# Patient Record
Sex: Female | Born: 1950
Health system: Southern US, Community
[De-identification: ages and names within clinical notes are randomized; demographics above are authoritative.]

## PROBLEM LIST (undated history)

## (undated) DIAGNOSIS — M8580 Other specified disorders of bone density and structure, unspecified site: Secondary | ICD-10-CM

## (undated) DIAGNOSIS — I10 Essential (primary) hypertension: Secondary | ICD-10-CM

## (undated) DIAGNOSIS — I493 Ventricular premature depolarization: Secondary | ICD-10-CM

## (undated) HISTORY — DX: Essential (primary) hypertension: I10

## (undated) HISTORY — PX: ABDOMINAL HYSTERECTOMY: SHX81

## (undated) HISTORY — DX: Other specified disorders of bone density and structure, unspecified site: M85.80

## (undated) HISTORY — PX: COLON SURGERY: SHX602

## (undated) HISTORY — PX: BREAST CYST EXCISION: SHX579

## (undated) HISTORY — PX: BREAST SURGERY: SHX581

## (undated) HISTORY — PX: BREAST EXCISIONAL BIOPSY: SUR124

## (undated) HISTORY — DX: Ventricular premature depolarization: I49.3

## (undated) HISTORY — PX: COLON RESECTION: SHX5231

---

## 2019-08-29 ENCOUNTER — Ambulatory Visit (INDEPENDENT_AMBULATORY_CARE_PROVIDER_SITE_OTHER): Payer: Medicare Other | Admitting: Adult Health Nurse Practitioner

## 2019-08-29 ENCOUNTER — Encounter: Payer: Self-pay | Admitting: Adult Health Nurse Practitioner

## 2019-08-29 ENCOUNTER — Other Ambulatory Visit: Payer: Self-pay

## 2019-08-29 VITALS — BP 151/90 | HR 81 | Temp 98.1°F | Ht 63.0 in | Wt 150.4 lb

## 2019-08-29 DIAGNOSIS — C44111 Basal cell carcinoma of skin of unspecified eyelid, including canthus: Secondary | ICD-10-CM

## 2019-08-29 DIAGNOSIS — E559 Vitamin D deficiency, unspecified: Secondary | ICD-10-CM | POA: Insufficient documentation

## 2019-08-29 DIAGNOSIS — Z780 Asymptomatic menopausal state: Secondary | ICD-10-CM | POA: Insufficient documentation

## 2019-08-29 DIAGNOSIS — Z1231 Encounter for screening mammogram for malignant neoplasm of breast: Secondary | ICD-10-CM | POA: Diagnosis not present

## 2019-08-29 DIAGNOSIS — I10 Essential (primary) hypertension: Secondary | ICD-10-CM | POA: Insufficient documentation

## 2019-08-29 DIAGNOSIS — M85859 Other specified disorders of bone density and structure, unspecified thigh: Secondary | ICD-10-CM | POA: Diagnosis not present

## 2019-08-29 DIAGNOSIS — M8589 Other specified disorders of bone density and structure, multiple sites: Secondary | ICD-10-CM

## 2019-08-29 DIAGNOSIS — M8580 Other specified disorders of bone density and structure, unspecified site: Secondary | ICD-10-CM | POA: Insufficient documentation

## 2019-08-29 LAB — POCT URINALYSIS DIP (MANUAL ENTRY)
Bilirubin, UA: NEGATIVE
Glucose, UA: NEGATIVE mg/dL
Ketones, POC UA: NEGATIVE mg/dL
Leukocytes, UA: NEGATIVE
Nitrite, UA: NEGATIVE
Protein Ur, POC: NEGATIVE mg/dL
Spec Grav, UA: 1.015 (ref 1.010–1.025)
Urobilinogen, UA: 0.2 E.U./dL
pH, UA: 7.5 (ref 5.0–8.0)

## 2019-08-29 MED ORDER — MOMETASONE FUROATE 50 MCG/ACT NA SUSP: 2.0000 | Freq: Every day | NASAL | 12 refills | Status: DC

## 2019-08-29 NOTE — Progress Notes (Signed)
.  Chief Complaint  Patient presents with  . Establish Care    nasal drainage x1 week    HPI   Patient presents to establish care.  She notes a hx of osteopenia, diagnosed in 2016.  She had a colon resection in 2004 with subsequent negative colonoscopies, last normal in 2019.  Last mammogram 2019, negative.   Family history is significant for DM, hypertension, Alzheimer's and prostate cancer in father.  Mother had melanoma and currently, dementia.  2016--osteopenia  Current problems include nasal congestion/allergies and elevated BP.  We discussed treating BP and ordering baseline labs.    Problem List    Problem List: 2021-02: Osteopenia of neck of femur 2021-02: Basal cell carcinoma (BCC) of canthus 2021-02: Encounter for screening mammogram for malignant neoplasm of  breast 2021-02: Vitamin D deficiency 2021-02: Essential hypertension   Allergies   has No Known Allergies.  Medications    Current Outpatient Medications:  .  amLODipine (NORVASC) 5 MG tablet, TAKE 1 TABLET BY MOUTH EVERY DAY, Disp: , Rfl:  .  hydrochlorothiazide (HYDRODIURIL) 25 MG tablet, TAKE 1 TABLET BY MOUTH EVERY DAY AS DIRECTED, Disp: , Rfl:  .  lisinopril-hydrochlorothiazide (ZESTORETIC) 20-25 MG tablet, Take 1 tablet by mouth daily., Disp: 30 tablet, Rfl: 3 .  mometasone (NASONEX) 50 MCG/ACT nasal spray, Place 2 sprays into the nose daily., Disp: 17 g, Rfl: 12   Review of Systems    Constitutional: Negative for activity change, appetite change, chills and fever.  HENT: Negative for congestion, nosebleeds, trouble swallowing and voice change.   Respiratory: Negative for cough, shortness of breath and wheezing.   Cardiac:  Negative for chest pain, pressure, syncope  Gastrointestinal: Negative for diarrhea, nausea and vomiting.  Genitourinary: Negative for difficulty urinating, dysuria, flank pain and hematuria.  Musculoskeletal: Negative for back pain, joint swelling and neck pain.    Neurological: Negative for dizziness, speech difficulty, light-headedness and numbness.  See HPI. All other review of systems negative.     Physical Exam:    height is 5' 3" (1.6 m) and weight is 150 lb 6.4 oz (68.2 kg). Her temporal temperature is 98.1 F (36.7 C). Her blood pressure is 151/90 (abnormal) and her pulse is 81. Her oxygen saturation is 94%.   Physical Examination: General appearance - alert, well appearing, and in no distress and oriented to person, place, and time Mental status - normal mood, behavior, speech, dress, motor activity, and thought processes Eyes - PERRL. Extraocular movements intact.  No nystagmus.  Neck - supple, no significant adenopathy, carotids upstroke normal bilaterally, no bruits, thyroid exam: thyroid is normal in size without nodules or tenderness Chest - clear to auscultation, no wheezes, rales or rhonchi, symmetric air entry  Heart - normal rate, regular rhythm, normal S1, S2, no murmurs, rubs, clicks or gallops Extremities - dependent LE edema without clubbing or cyanosis Skin - normal coloration and turgor, no rashes, no suspicious skin lesions noted  No hyperpigmentation of skin.  No current hematomas noted   Lab /Imaging Review    orders written for new lab studies as appropriate; see orders, no lab studies available for review at time of visit.   Assessment & Plan:  Prerana Strayer is a 69 y.o. female    1. Encounter for screening mammogram for malignant neoplasm of breast   2. Basal cell carcinoma of canthus, unspecified laterality   3. Osteopenia of neck of femur, unspecified laterality   4. Vitamin D deficiency   5. Essential hypertension  6. Osteopenia of multiple sites     Orders Placed This Encounter  Procedures  . MM Digital Screening  . DG Bone Density  . CMP14+EGFR  . Lipid panel  . TSH  . VITAMIN D 25 Hydroxy (Vit-D Deficiency, Fractures)  . Ambulatory referral to Gynecology  . Ambulatory referral to Dermatology  .  POCT urinalysis dipstick   Meds ordered this encounter  Medications  . mometasone (NASONEX) 50 MCG/ACT nasal spray    Sig: Place 2 sprays into the nose daily.    Dispense:  17 g    Refill:  12  . lisinopril-hydrochlorothiazide (ZESTORETIC) 20-25 MG tablet    Sig: Take 1 tablet by mouth daily.    Dispense:  30 tablet    Refill:  3     Glyn Ade, NP

## 2019-08-29 NOTE — Patient Instructions (Addendum)
 Health Maintenance After Age 68 After age 68, you are at a higher risk for certain long-term diseases and infections as well as injuries from falls. Falls are a major cause of broken bones and head injuries in people who are older than age 68. Getting regular preventive care can help to keep you healthy and well. Preventive care includes getting regular testing and making lifestyle changes as recommended by your health care provider. Talk with your health care provider about:  Which screenings and tests you should have. A screening is a test that checks for a disease when you have no symptoms.  A diet and exercise plan that is right for you. What should I know about screenings and tests to prevent falls? Screening and testing are the best ways to find a health problem early. Early diagnosis and treatment give you the best chance of managing medical conditions that are common after age 68. Certain conditions and lifestyle choices may make you more likely to have a fall. Your health care provider may recommend:  Regular vision checks. Poor vision and conditions such as cataracts can make you more likely to have a fall. If you wear glasses, make sure to get your prescription updated if your vision changes.  Medicine review. Work with your health care provider to regularly review all of the medicines you are taking, including over-the-counter medicines. Ask your health care provider about any side effects that may make you more likely to have a fall. Tell your health care provider if any medicines that you take make you feel dizzy or sleepy.  Osteoporosis screening. Osteoporosis is a condition that causes the bones to get weaker. This can make the bones weak and cause them to break more easily.  Blood pressure screening. Blood pressure changes and medicines to control blood pressure can make you feel dizzy.  Strength and balance checks. Your health care provider may recommend certain tests to check  your strength and balance while standing, walking, or changing positions.  Foot health exam. Foot pain and numbness, as well as not wearing proper footwear, can make you more likely to have a fall.  Depression screening. You may be more likely to have a fall if you have a fear of falling, feel emotionally low, or feel unable to do activities that you used to do.  Alcohol use screening. Using too much alcohol can affect your balance and may make you more likely to have a fall. What actions can I take to lower my risk of falls? General instructions  Talk with your health care provider about your risks for falling. Tell your health care provider if: ? You fall. Be sure to tell your health care provider about all falls, even ones that seem minor. ? You feel dizzy, sleepy, or off-balance.  Take over-the-counter and prescription medicines only as told by your health care provider. These include any supplements.  Eat a healthy diet and maintain a healthy weight. A healthy diet includes low-fat dairy products, low-fat (lean) meats, and fiber from whole grains, beans, and lots of fruits and vegetables. Home safety  Remove any tripping hazards, such as rugs, cords, and clutter.  Install safety equipment such as grab bars in bathrooms and safety rails on stairs.  Keep rooms and walkways well-lit. Activity   Follow a regular exercise program to stay fit. This will help you maintain your balance. Ask your health care provider what types of exercise are appropriate for you.  If you need a cane   or walker, use it as recommended by your health care provider.  Wear supportive shoes that have nonskid soles. Lifestyle  Do not drink alcohol if your health care provider tells you not to drink.  If you drink alcohol, limit how much you have: ? 0-1 drink a day for women. ? 0-2 drinks a day for men.  Be aware of how much alcohol is in your drink. In the [place] one drink equals one typical bottle of  beer (12 oz), one-half glass of wine (5 oz), or one shot of hard liquor (1 oz).  Do not use any products that contain nicotine or tobacco, such as cigarettes and e-cigarettes. If you need help quitting, ask your health care provider. Summary  Having a healthy lifestyle and getting preventive care can help to protect your health and wellness after age 68.  Screening and testing are the best way to find a health problem early and help you avoid having a fall. Early diagnosis and treatment give you the best chance for managing medical conditions that are more common for people who are older than age 68.  Falls are a major cause of broken bones and head injuries in people who are older than age 68. Take precautions to prevent a fall at home.  Work with your health care provider to learn what changes you can make to improve your health and wellness and to prevent falls. This information is not intended to replace advice given to you by your health care provider. Make sure you discuss any questions you have with your health care provider. Document Revised: 10/13/2018 Document Reviewed: 05/05/2017 Elsevier Patient Education  2020 [employer]    If you have lab work done today you will be contacted with your lab results within the next 2 weeks.  If you have not heard from us then please contact us. The fastest way to get your results is to register for My Chart.   IF you received an x-ray today, you will receive an invoice from [facility]. Please contact [facility] at [phone] with questions or concerns regarding your invoice.   IF you received labwork today, you will receive an invoice from [facility]. Please contact [facility] at [phone] with questions or concerns regarding your invoice.   Our billing staff will not be able to assist you with questions regarding bills from these companies.  You will be contacted with the lab results as soon as they are available. The  fastest way to get your results is to activate your My Chart account. Instructions are located on the last page of this paperwork. If you have not heard from us regarding the results in 2 weeks, please contact this office.      

## 2019-08-30 LAB — CMP14+EGFR
ALT: 10 IU/L (ref 0–32)
AST: 20 IU/L (ref 0–40)
Albumin/Globulin Ratio: 1.1 — ABNORMAL LOW (ref 1.2–2.2)
Albumin: 4 g/dL (ref 3.8–4.8)
Alkaline Phosphatase: 94 IU/L (ref 39–117)
BUN/Creatinine Ratio: 12 (ref 12–28)
BUN: 10 mg/dL (ref 8–27)
Bilirubin Total: 0.3 mg/dL (ref 0.0–1.2)
CO2: 25 mmol/L (ref 20–29)
Calcium: 9.4 mg/dL (ref 8.7–10.3)
Chloride: 102 mmol/L (ref 96–106)
Creatinine, Ser: 0.82 mg/dL (ref 0.57–1.00)
GFR calc Af Amer: 86 mL/min/{1.73_m2} (ref >59)
GFR calc non Af Amer: 74 mL/min/{1.73_m2} (ref >59)
Globulin, Total: 3.5 g/dL (ref 1.5–4.5)
Glucose: 92 mg/dL (ref 65–99)
Potassium: 3.8 mmol/L (ref 3.5–5.2)
Sodium: 142 mmol/L (ref 134–144)
Total Protein: 7.5 g/dL (ref 6.0–8.5)

## 2019-08-30 LAB — VITAMIN D 25 HYDROXY (VIT D DEFICIENCY, FRACTURES): Vit D, 25-Hydroxy: 34.8 ng/mL (ref 30.0–100.0)

## 2019-08-30 LAB — LIPID PANEL
Chol/HDL Ratio: 3.7 ratio (ref 0.0–4.4)
Cholesterol, Total: 191 mg/dL (ref 100–199)
HDL: 52 mg/dL (ref >39)
LDL Chol Calc (NIH): 118 mg/dL — ABNORMAL HIGH (ref 0–99)
Triglycerides: 116 mg/dL (ref 0–149)
VLDL Cholesterol Cal: 21 mg/dL (ref 5–40)

## 2019-08-30 LAB — TSH: TSH: 1.94 u[IU]/mL (ref 0.450–4.500)

## 2019-08-30 MED ORDER — LISINOPRIL-HYDROCHLOROTHIAZIDE 20-25 MG PO TABS: 1.0000 | ORAL_TABLET | Freq: Every day | ORAL | 3 refills | Status: DC

## 2019-09-12 ENCOUNTER — Encounter: Payer: Self-pay | Admitting: Adult Health Nurse Practitioner

## 2019-09-12 NOTE — Progress Notes (Signed)
Spoke with pt to give her her lab results.

## 2019-09-15 ENCOUNTER — Telehealth: Payer: Self-pay | Admitting: Adult Health Nurse Practitioner

## 2019-09-15 NOTE — Telephone Encounter (Signed)
Spoke with pt and she understands to continue taking the hydrochlorothiazide until her F/U appt.

## 2019-09-15 NOTE — Telephone Encounter (Signed)
Pt called had a new pt apt with provider on 08/29/19 and is requesting a medication refill. Pt states that she has been out for a week already. Pt also has an apt with provider on 09/21/19. Please advise.  hydrochlorothiazide (HYDRODIURIL) 25 MG tablet [id number]  [id number] [place], [place] - [address]

## 2019-09-21 ENCOUNTER — Other Ambulatory Visit: Payer: Self-pay

## 2019-09-21 ENCOUNTER — Encounter: Payer: Self-pay | Admitting: Adult Health Nurse Practitioner

## 2019-09-21 ENCOUNTER — Ambulatory Visit (INDEPENDENT_AMBULATORY_CARE_PROVIDER_SITE_OTHER): Payer: Medicare Other | Admitting: Adult Health Nurse Practitioner

## 2019-09-21 VITALS — BP 126/75 | HR 88 | Temp 98.7°F | Ht 63.0 in | Wt 150.0 lb

## 2019-09-21 DIAGNOSIS — M85859 Other specified disorders of bone density and structure, unspecified thigh: Secondary | ICD-10-CM | POA: Diagnosis not present

## 2019-09-21 DIAGNOSIS — I10 Essential (primary) hypertension: Secondary | ICD-10-CM | POA: Diagnosis not present

## 2019-09-21 DIAGNOSIS — Z1231 Encounter for screening mammogram for malignant neoplasm of breast: Secondary | ICD-10-CM | POA: Diagnosis not present

## 2019-09-21 LAB — POCT URINALYSIS DIP (CLINITEK)
Bilirubin, UA: NEGATIVE
Glucose, UA: NEGATIVE mg/dL
Ketones, POC UA: NEGATIVE mg/dL
Leukocytes, UA: NEGATIVE
Nitrite, UA: NEGATIVE
POC PROTEIN,UA: NEGATIVE
Spec Grav, UA: 1.02 (ref 1.010–1.025)
Urobilinogen, UA: 0.2 E.U./dL
pH, UA: 5.5 (ref 5.0–8.0)

## 2019-09-21 MED ORDER — HYDROCHLOROTHIAZIDE 25 MG PO TABS: 25.0000 mg | ORAL_TABLET | Freq: Every day | ORAL | 2 refills | Status: DC

## 2019-09-21 MED ORDER — AMLODIPINE BESYLATE 5 MG PO TABS: 5.0000 mg | ORAL_TABLET | Freq: Every day | ORAL | 2 refills | Status: DC

## 2019-09-21 NOTE — Patient Instructions (Signed)
° ° ° °  If you have lab work done today you will be contacted with your lab results within the next 2 weeks.  If you have not heard from us then please contact us. The fastest way to get your results is to register for My Chart. ° ° °IF you received an x-ray today, you will receive an invoice from [facility]. Please contact [facility] at [phone] with questions or concerns regarding your invoice.  ° °IF you received labwork today, you will receive an invoice from [facility]. Please contact [facility] at [phone] with questions or concerns regarding your invoice.  ° °Our billing staff will not be able to assist you with questions regarding bills from these companies. ° °You will be contacted with the lab results as soon as they are available. The fastest way to get your results is to activate your My Chart account. Instructions are located on the last page of this paperwork. If you have not heard from us regarding the results in 2 weeks, please contact this office. °  ° ° ° °

## 2019-09-21 NOTE — Progress Notes (Signed)
Subjective:    Samantha Brady is here for follow-up of her hypertension. Her high blood pressure was first noted in the past. Home blood pressure readings: she brought in today show BPs from 0000000 systolic over [id number] diastolic. Salt intake and diet: salt not added to cooking. Usual weight: at usual weight . Associated signs and symptoms: none. Patient denies: none. Use of agents associated with hypertension: none. Medication compliance: taking as prescribed.  The following portions of the patient's history were reviewed and updated as appropriate:  She  has a past medical history of Hypertension. Current Outpatient Medications on File Prior to Visit  Medication Sig Dispense Refill  . mometasone (NASONEX) 50 MCG/ACT nasal spray Place 2 sprays into the nose daily. 17 g 12   No current facility-administered medications on file prior to visit.   She has No Known Allergies..   Review of Systems Pertinent items noted in HPI and remainder of comprehensive ROS otherwise negative.     Objective:    Physical Exam: BP 126/75 (BP Location: Right Arm, Patient Position: Sitting, Cuff Size: Normal)   Pulse 88   Temp 98.7 F (37.1 C) (Temporal)   Ht 5\' 3"  (1.6 m)   Wt 150 lb (68 kg)   SpO2 94%   BMI 26.57 kg/m  General appearance: alert and cooperative Lungs: clear to auscultation bilaterally Heart: regular rate and rhythm, S1, S2 normal, no murmur, click, rub or gallop Pulses: 2+ and symmetric Skin: Skin color, texture, turgor normal. No rashes or lesions  Lab Review  Lab Results  Component Value Date   CHOL 191 08/29/2019   TRIG 116 08/29/2019   HDL 52 08/29/2019       Assessment:    Hypertension, controlled on current medication   Plan:    Medication: no change. Regular aerobic exercise. Follow up: 3 months and as needed.    Glyn Ade, NP

## 2019-12-01 ENCOUNTER — Telehealth: Payer: Self-pay | Admitting: Adult Health Nurse Practitioner

## 2019-12-01 NOTE — Telephone Encounter (Signed)
Called pt LVMTCB 12/01/19 @ 2:32 Pm

## 2019-12-21 ENCOUNTER — Ambulatory Visit: Payer: Medicare Other | Admitting: Adult Health Nurse Practitioner

## 2020-01-18 ENCOUNTER — Other Ambulatory Visit: Payer: Self-pay

## 2020-01-18 ENCOUNTER — Encounter: Payer: Self-pay | Admitting: Family Medicine

## 2020-01-18 ENCOUNTER — Telehealth: Payer: Self-pay

## 2020-01-18 ENCOUNTER — Ambulatory Visit (INDEPENDENT_AMBULATORY_CARE_PROVIDER_SITE_OTHER): Payer: Medicare Other | Admitting: Family Medicine

## 2020-01-18 VITALS — BP 117/77 | HR 75 | Temp 97.7°F | Ht 63.0 in | Wt 147.2 lb

## 2020-01-18 DIAGNOSIS — Z1231 Encounter for screening mammogram for malignant neoplasm of breast: Secondary | ICD-10-CM

## 2020-01-18 DIAGNOSIS — Z780 Asymptomatic menopausal state: Secondary | ICD-10-CM | POA: Diagnosis not present

## 2020-01-18 DIAGNOSIS — I10 Essential (primary) hypertension: Secondary | ICD-10-CM | POA: Diagnosis not present

## 2020-01-18 DIAGNOSIS — Z86010 Personal history of colonic polyps: Secondary | ICD-10-CM

## 2020-01-18 DIAGNOSIS — E559 Vitamin D deficiency, unspecified: Secondary | ICD-10-CM

## 2020-01-18 DIAGNOSIS — Z1159 Encounter for screening for other viral diseases: Secondary | ICD-10-CM

## 2020-01-18 NOTE — Telephone Encounter (Signed)
Medical records, immunizations and colonoscopy request was faxed to pt pcp and gastro doctor in [place] for continuity of care.

## 2020-01-18 NOTE — Patient Instructions (Signed)
° ° ° °  If you have lab work done today you will be contacted with your lab results within the next 2 weeks.  If you have not heard from us then please contact us. The fastest way to get your results is to register for My Chart. ° ° °IF you received an x-ray today, you will receive an invoice from [facility]. Please contact  Radiology at [phone] with questions or concerns regarding your invoice.  ° °IF you received labwork today, you will receive an invoice from [facility]. Please contact [facility] at [phone] with questions or concerns regarding your invoice.  ° °Our billing staff will not be able to assist you with questions regarding bills from these companies. ° °You will be contacted with the lab results as soon as they are available. The fastest way to get your results is to activate your My Chart account. Instructions are located on the last page of this paperwork. If you have not heard from us regarding the results in 2 weeks, please contact this office. °  ° ° ° °

## 2020-01-18 NOTE — Progress Notes (Signed)
7/15/202111:36 AM  Samantha Brady [birth year], 69 y.o., female [id number]  Chief Complaint  Patient presents with  . Hypertension    HPI:   Patient is a 69 y.o. female with past medical history significant for HTN, vitamin D deficiency, osteopenia who presents today for routine followup/transfer of care  Last OV march 2021, Byrd NP - no changes  HTN - takes amlodipine and HCTZ daily denies any history of hypokalemia Lab Results  Component Value Date   CREATININE 0.82 08/29/2019   BUN 10 08/29/2019   NA 142 08/29/2019   K 3.8 08/29/2019   CL 102 08/29/2019   CO2 25 08/29/2019    Vitamin D deficiency/osteopenia -  takes 2000 units a day, used to walk 3-4 times a week,  now walking 1-2 a week due to covid,  planning on increasing frequency Last dexa more than 2 years ago  Last mammogram 2018 Had partial hyst for fibroids, one ovary remains  Had colon resection in 2003 due to very large precancerous polyp, was going every 3 years with last colonoscopy in 2018 or 2019 was normal, told she can now go to every 10 years  Reports [facility]  Recently moved from [place] - records not available thru care everywhere  Depression screen [facility] 2/9 01/18/2020 09/21/2019 08/29/2019  Decreased Interest 0 0 0  Down, Depressed, Hopeless 0 0 0  PHQ - 2 Score 0 0 0    Fall Risk  01/18/2020 09/21/2019 08/29/2019  Falls in the past year? 0 0 0  Number falls in past yr: 0 - 0  Injury with Fall? 0 0 0  Follow up - Falls evaluation completed Falls evaluation completed     No Known Allergies  Prior to Admission medications   Medication Sig Start Date End Date Taking? Authorizing Provider  amLODipine (NORVASC) 5 MG tablet Take 1 tablet (5 mg total) by mouth daily. 09/21/19 01/18/20 Yes Wendall Mola, NP  hydrochlorothiazide (HYDRODIURIL) 25 MG tablet Take 1 tablet (25 mg total) by mouth daily. 09/21/19 01/18/20 Yes Wendall Mola, NP  mometasone (NASONEX)  50 MCG/ACT nasal spray Place 2 sprays into the nose daily. 08/29/19  Yes Wendall Mola, NP    Past Medical History:  Diagnosis Date  . Hypertension     Past Surgical History:  Procedure Laterality Date  . ABDOMINAL HYSTERECTOMY    . BREAST SURGERY    . COLON SURGERY      Social History   Tobacco Use  . Smoking status: Never Smoker  . Smokeless tobacco: Never Used  Substance Use Topics  . Alcohol use: Never    Family History  Problem Relation Age of Onset  . Hypertension Mother   . Cancer Father   . Diabetes Father   . Hypertension Father     Review of Systems  Constitutional: Negative for chills and fever.  Respiratory: Negative for cough and shortness of breath.   Cardiovascular: Negative for chest pain, palpitations and leg swelling.  Gastrointestinal: Negative for abdominal pain, nausea and vomiting.     OBJECTIVE:  Today's Vitals   01/18/20 1131  BP: 117/77  Pulse: 75  Temp: 97.7 F (36.5 C)  SpO2: 94%  Weight: 147 lb 3.2 oz (66.8 kg)  Height: 5' 3"  (1.6 m)   Body mass index is 26.08 kg/m.   Physical Exam Vitals and nursing note reviewed.  Constitutional:      Appearance: She is well-developed.  HENT:  Head: Normocephalic and atraumatic.     Mouth/Throat:     Pharynx: No oropharyngeal exudate.  Eyes:     General: No scleral icterus.    Conjunctiva/sclera: Conjunctivae normal.     Pupils: Pupils are equal, round, and reactive to light.  Cardiovascular:     Rate and Rhythm: Normal rate and regular rhythm.     Heart sounds: Normal heart sounds. No murmur heard.  No friction rub. No gallop.   Pulmonary:     Effort: Pulmonary effort is normal.     Breath sounds: Normal breath sounds. No wheezing or rales.  Musculoskeletal:     Cervical back: Neck supple.  Skin:    General: Skin is warm and dry.  Neurological:     Mental Status: She is alert and oriented to person, place, and time.     No results found for this or any previous  visit (from the past 24 hour(s)).  No results found.   ASSESSMENT and PLAN  1. Essential hypertension Controlled. Continue current regime.  - Lipid panel - CMP14+EGFR  2. Vitamin D deficiency Checking labs today, medications will be adjusted as needed.  - VITAMIN D 25 Hydroxy (Vit-D Deficiency, Fractures)  3. Visit for screening mammogram - MM DIGITAL SCREENING BILATERAL; Future  4. Postmenopausal estrogen deficiency dexa for routine surveillance of osteopenia. Cont with bone health [facility] - DG Bone Density; Future  5. Encounter for hepatitis C screening test for low risk patient - Hepatitis C antibody  6. History of colonic polyps Medical records requested. Per patient colonoscopy is UTD  Other orders  Return in about 6 months (around 07/20/2020).    Rutherford Guys, MD [facility] [place], [place] [address] Ph.  [phone] Fax [phone]

## 2020-01-19 LAB — CMP14+EGFR
ALT: 12 IU/L (ref 0–32)
AST: 18 IU/L (ref 0–40)
Albumin/Globulin Ratio: 1.5 (ref 1.2–2.2)
Albumin: 4.3 g/dL (ref 3.8–4.8)
Alkaline Phosphatase: 84 IU/L (ref 48–121)
BUN/Creatinine Ratio: 18 (ref 12–28)
BUN: 15 mg/dL (ref 8–27)
Bilirubin Total: 0.4 mg/dL (ref 0.0–1.2)
CO2: 27 mmol/L (ref 20–29)
Calcium: 9.4 mg/dL (ref 8.7–10.3)
Chloride: 100 mmol/L (ref 96–106)
Creatinine, Ser: 0.82 mg/dL (ref 0.57–1.00)
GFR calc Af Amer: 85 mL/min/{1.73_m2} (ref >59)
GFR calc non Af Amer: 74 mL/min/{1.73_m2} (ref >59)
Globulin, Total: 2.8 g/dL (ref 1.5–4.5)
Glucose: 84 mg/dL (ref 65–99)
Potassium: 3.4 mmol/L — ABNORMAL LOW (ref 3.5–5.2)
Sodium: 141 mmol/L (ref 134–144)
Total Protein: 7.1 g/dL (ref 6.0–8.5)

## 2020-01-19 LAB — LIPID PANEL
Chol/HDL Ratio: 3.8 ratio (ref 0.0–4.4)
Cholesterol, Total: 210 mg/dL — ABNORMAL HIGH (ref 100–199)
HDL: 55 mg/dL (ref >39)
LDL Chol Calc (NIH): 136 mg/dL — ABNORMAL HIGH (ref 0–99)
Triglycerides: 106 mg/dL (ref 0–149)
VLDL Cholesterol Cal: 19 mg/dL (ref 5–40)

## 2020-01-19 LAB — HEPATITIS C ANTIBODY: Hep C Virus Ab: <0.1 s/co ratio (ref 0.0–0.9)

## 2020-01-19 LAB — VITAMIN D 25 HYDROXY (VIT D DEFICIENCY, FRACTURES): Vit D, 25-Hydroxy: 62.7 ng/mL (ref 30.0–100.0)

## 2020-01-22 ENCOUNTER — Other Ambulatory Visit: Payer: Self-pay | Admitting: Family Medicine

## 2020-01-22 DIAGNOSIS — E876 Hypokalemia: Secondary | ICD-10-CM

## 2020-01-22 MED ORDER — POTASSIUM CHLORIDE ER 10 MEQ PO CPCR: 10.0000 meq | ORAL_CAPSULE | Freq: Every day | ORAL | 0 refills | Status: DC

## 2020-02-02 ENCOUNTER — Encounter: Payer: Self-pay | Admitting: Family Medicine

## 2020-02-02 LAB — HM COLONOSCOPY

## 2020-02-09 ENCOUNTER — Telehealth: Payer: Self-pay

## 2020-02-09 ENCOUNTER — Ambulatory Visit (INDEPENDENT_AMBULATORY_CARE_PROVIDER_SITE_OTHER): Payer: Medicare Other | Admitting: Family Medicine

## 2020-02-09 ENCOUNTER — Other Ambulatory Visit: Payer: Self-pay

## 2020-02-09 DIAGNOSIS — E876 Hypokalemia: Secondary | ICD-10-CM

## 2020-02-09 NOTE — Telephone Encounter (Signed)
Pt submitted a letter to Dr. Pamella Pert regarding her blood pressure readings. After reviewing of the letter, the doctor approved pt to discontinue blood pressure medication. Pt is very pleased with the doctors approval. No follow up needed at this time.

## 2020-02-10 LAB — BASIC METABOLIC PANEL
BUN/Creatinine Ratio: 13 (ref 12–28)
BUN: 11 mg/dL (ref 8–27)
CO2: 26 mmol/L (ref 20–29)
Calcium: 9.3 mg/dL (ref 8.7–10.3)
Chloride: 107 mmol/L — ABNORMAL HIGH (ref 96–106)
Creatinine, Ser: 0.88 mg/dL (ref 0.57–1.00)
GFR calc Af Amer: 78 mL/min/{1.73_m2} (ref >59)
GFR calc non Af Amer: 68 mL/min/{1.73_m2} (ref >59)
Glucose: 94 mg/dL (ref 65–99)
Potassium: 4.2 mmol/L (ref 3.5–5.2)
Sodium: 142 mmol/L (ref 134–144)

## 2020-04-17 ENCOUNTER — Ambulatory Visit
Admission: RE | Admit: 2020-04-17 | Discharge: 2020-04-17 | Disposition: A | Discharge status: Home / Self Care | Payer: Medicare Other | Source: Ambulatory Visit | Attending: Family Medicine | Admitting: Family Medicine

## 2020-04-17 ENCOUNTER — Other Ambulatory Visit: Payer: Self-pay | Admitting: Family Medicine

## 2020-04-17 ENCOUNTER — Other Ambulatory Visit: Payer: Self-pay

## 2020-04-17 DIAGNOSIS — Z1231 Encounter for screening mammogram for malignant neoplasm of breast: Secondary | ICD-10-CM

## 2020-04-17 DIAGNOSIS — Z780 Asymptomatic menopausal state: Secondary | ICD-10-CM

## 2020-04-26 ENCOUNTER — Other Ambulatory Visit: Payer: Self-pay | Admitting: Family Medicine

## 2020-04-26 DIAGNOSIS — R928 Other abnormal and inconclusive findings on diagnostic imaging of breast: Secondary | ICD-10-CM

## 2020-05-13 ENCOUNTER — Ambulatory Visit
Admission: RE | Admit: 2020-05-13 | Discharge: 2020-05-13 | Disposition: A | Discharge status: Home / Self Care | Payer: Medicare Other | Source: Ambulatory Visit | Attending: Family Medicine | Admitting: Family Medicine

## 2020-05-13 ENCOUNTER — Other Ambulatory Visit: Payer: Self-pay

## 2020-05-13 DIAGNOSIS — R928 Other abnormal and inconclusive findings on diagnostic imaging of breast: Secondary | ICD-10-CM

## 2020-06-19 ENCOUNTER — Other Ambulatory Visit: Payer: Self-pay

## 2020-06-20 ENCOUNTER — Encounter: Payer: Self-pay | Admitting: Family Medicine

## 2020-06-20 ENCOUNTER — Ambulatory Visit (INDEPENDENT_AMBULATORY_CARE_PROVIDER_SITE_OTHER): Payer: Medicare Other | Admitting: Family Medicine

## 2020-06-20 VITALS — BP 120/70 | HR 51 | Temp 98.0°F | Ht 62.0 in | Wt 149.2 lb

## 2020-06-20 DIAGNOSIS — I10 Essential (primary) hypertension: Secondary | ICD-10-CM

## 2020-06-20 DIAGNOSIS — Z23 Encounter for immunization: Secondary | ICD-10-CM | POA: Diagnosis not present

## 2020-06-20 MED ORDER — HYDROCHLOROTHIAZIDE 25 MG PO TABS: 25.0000 mg | ORAL_TABLET | Freq: Every day | ORAL | 3 refills | Status: DC

## 2020-06-20 NOTE — Addendum Note (Signed)
Addended by: Konrad Saha on: 06/20/2020 01:47 PM   Modules accepted: Orders

## 2020-06-20 NOTE — Progress Notes (Signed)
Samantha Brady is a 69 y.o. female  Chief Complaint  Patient presents with  . Establish Care    NP- establish care.  C/o elevated BP and hasn't taken one of her BP meds in 10 days.  Wants flu shot today.      HPI: Samantha Brady is a 69 y.o. female seen today as a new patient to establish care with our office. Previous PCP Dr. Pamella Pert. She moved from [place] about 1 year ago. She has a h/o HTN and is taking HCTZ 25mg  daily and norvasc 5mg  daily. She has been out of norvasc 5mg  daily x 10 days or so, but she has been taking her HCTZ. She has not checked BP at home in the past few weeks but does have a cuff at home.   Pt would like flu vaccine today.    Lab Results  Component Value Date   NA 142 02/09/2020   K 4.2 02/09/2020   CREATININE 0.88 02/09/2020   GFRNONAA 68 02/09/2020   GFRAA 78 02/09/2020   GLUCOSE 94 02/09/2020     Past Medical History:  Diagnosis Date  . Hypertension     Past Surgical History:  Procedure Laterality Date  . ABDOMINAL HYSTERECTOMY    . BREAST CYST EXCISION Left    unsure of year   . BREAST EXCISIONAL BIOPSY Left   . BREAST SURGERY    . COLON SURGERY      Social History   Socioeconomic History  . Marital status: Single    Spouse name: Not on file  . Number of children: Not on file  . Years of education: Not on file  . Highest education level: Not on file  Occupational History  . Not on file  Tobacco Use  . Smoking status: Never Smoker  . Smokeless tobacco: Never Used  Vaping Use  . Vaping Use: Never used  Substance and Sexual Activity  . Alcohol use: Never  . Drug use: Never  . Sexual activity: Yes  Other Topics Concern  . Not on file  Social History Narrative  . Not on file   Social Determinants of Health   Financial Resource Strain: Not on file  Food Insecurity: Not on file  Transportation Needs: Not on file  Physical Activity: Not on file  Stress: Not on file  Social Connections: Not on file  Intimate Partner  Violence: Not on file    Family History  Problem Relation Age of Onset  . Hypertension Mother   . Cancer Father   . Diabetes Father   . Hypertension Father      Immunization History  Administered Date(s) Administered  . PFIZER SARS-COV-2 Vaccination 09/27/2019, 10/18/2019  . Tdap 01/31/2018, 12/09/2018    Outpatient Encounter Medications as of 06/20/2020  Medication Sig  . amoxicillin (AMOXIL) 500 MG capsule Take 500 mg by mouth 3 (three) times daily.  Marland Kitchen BIOTIN 5000 PO Take by mouth.  . Cholecalciferol (VITAMIN D) 50 MCG (2000 UT) tablet Take 2,000 Units by mouth daily.  . hydrochlorothiazide (HYDRODIURIL) 25 MG tablet Take 1 tablet (25 mg total) by mouth daily.  Marland Kitchen amLODipine (NORVASC) 5 MG tablet Take 1 tablet (5 mg total) by mouth daily.  . chlorhexidine (PERIDEX) 0.12 % solution SMARTSIG:By Mouth  . HYDROcodone-acetaminophen (NORCO/VICODIN) 5-325 MG tablet SMARTSIG:1 By Mouth 4-5 Times Daily (Patient not taking: Reported on 06/20/2020)  . ibuprofen (ADVIL) 400 MG tablet Take by mouth. (Patient not taking: Reported on 06/20/2020)  . mometasone (NASONEX) 50 MCG/ACT nasal  spray Place 2 sprays into the nose daily. (Patient not taking: Reported on 06/20/2020)  . potassium chloride (MICRO-K) 10 MEQ CR capsule Take 1 capsule (10 mEq total) by mouth daily. (Patient not taking: Reported on 06/20/2020)   No facility-administered encounter medications on file as of 06/20/2020.     ROS: This visit occurred during the SARS-CoV-2 public health emergency.  Safety protocols were in place, including screening questions prior to the visit, additional usage of staff PPE, and extensive cleaning of exam room while observing appropriate contact time as indicated for disinfecting solutions.    No Known Allergies  BP 120/70   Pulse (!) 51   Temp 98 F (36.7 C) (Temporal)   Ht 5\' 2"  (1.575 m)   Wt 149 lb 3.2 oz (67.7 kg)   SpO2 95%   BMI 27.29 kg/m    BP Readings from Last 3 Encounters:   06/20/20 120/70  01/18/20 117/77  09/21/19 126/75   Pulse Readings from Last 3 Encounters:  06/20/20 (!) 51  01/18/20 75  09/21/19 88   BP Readings from Last 3 Encounters:  06/20/20 120/70  01/18/20 117/77  09/21/19 126/75     Physical Exam Constitutional:      General: She is not in acute distress.    Appearance: Normal appearance. She is not ill-appearing.  Cardiovascular:     Rate and Rhythm: Normal rate and regular rhythm.     Pulses: Normal pulses.  Pulmonary:     Effort: Pulmonary effort is normal.     Breath sounds: Normal breath sounds. No wheezing or rhonchi.  Musculoskeletal:     Right lower leg: No edema.     Left lower leg: No edema.  Neurological:     General: No focal deficit present.     Mental Status: She is alert and oriented to person, place, and time.  Psychiatric:        Mood and Affect: Mood normal.        Behavior: Behavior normal.      A/P:  1. Essential hypertension - controlled, at goal - she has been off norvasc 5mg  daily x 10 days and BP at goal - pt will stay off amlodipine and check BP periodically. If BP at goal, will cont with just HCTZ. If BP above goal, will restart norvasc 5mg  daily Refill: - hydrochlorothiazide (HYDRODIURIL) 25 MG tablet; Take 1 tablet (25 mg total) by mouth daily.  Dispense: 90 tablet; Refill: 3 - BMP WNL in 02/2020 - f/u for CPE in 01/2021  This visit occurred during the SARS-CoV-2 public health emergency.  Safety protocols were in place, including screening questions prior to the visit, additional usage of staff PPE, and extensive cleaning of exam room while observing appropriate contact time as indicated for disinfecting solutions.

## 2020-07-13 ENCOUNTER — Other Ambulatory Visit: Payer: Medicare Other

## 2020-07-13 DIAGNOSIS — Z20822 Contact with and (suspected) exposure to covid-19: Secondary | ICD-10-CM

## 2020-07-16 LAB — NOVEL CORONAVIRUS, NAA: SARS-CoV-2, NAA: DETECTED — AB

## 2020-07-17 ENCOUNTER — Telehealth: Payer: Self-pay

## 2020-07-17 ENCOUNTER — Telehealth: Payer: Self-pay | Admitting: *Deleted

## 2020-07-17 NOTE — Telephone Encounter (Signed)
Contacted pt. In regard to COVID 19 infusion therapy. Pt. States her symptoms started 07/06/20. Pt. Is out of window for therapy.States she feels better.Verbalizes understanding.

## 2020-07-17 NOTE — Telephone Encounter (Signed)
No answer. Left VM to return call. 

## 2020-10-14 ENCOUNTER — Ambulatory Visit (INDEPENDENT_AMBULATORY_CARE_PROVIDER_SITE_OTHER): Payer: Medicare Other | Admitting: Ophthalmology

## 2020-10-14 ENCOUNTER — Encounter (INDEPENDENT_AMBULATORY_CARE_PROVIDER_SITE_OTHER): Payer: Self-pay | Admitting: Ophthalmology

## 2020-10-14 ENCOUNTER — Other Ambulatory Visit: Payer: Self-pay

## 2020-10-14 ENCOUNTER — Encounter (INDEPENDENT_AMBULATORY_CARE_PROVIDER_SITE_OTHER): Payer: Self-pay

## 2020-10-14 DIAGNOSIS — H33312 Horseshoe tear of retina without detachment, left eye: Secondary | ICD-10-CM | POA: Diagnosis not present

## 2020-10-14 DIAGNOSIS — H33322 Round hole, left eye: Secondary | ICD-10-CM | POA: Diagnosis not present

## 2020-10-14 DIAGNOSIS — H43811 Vitreous degeneration, right eye: Secondary | ICD-10-CM | POA: Insufficient documentation

## 2020-10-14 DIAGNOSIS — H33311 Horseshoe tear of retina without detachment, right eye: Secondary | ICD-10-CM | POA: Diagnosis not present

## 2020-10-14 NOTE — Progress Notes (Addendum)
Subjective:   Samantha Brady is a 70 y.o. female who presents for Medicare Annual (Subsequent) preventive examination.  I connected with Roizy today by telephone and verified that I am speaking with the correct person using two identifiers. Location patient: home Location provider: work Persons participating in the virtual visit: patient, Marine scientist.    I discussed the limitations, risks, security and privacy concerns of performing an evaluation and management service by telephone and the availability of in person appointments. I also discussed with the patient that there may be a patient responsible charge related to this service. The patient expressed understanding and verbally consented to this telephonic visit.    Interactive audio and video telecommunications were attempted between this provider and patient, however failed, due to patient having technical difficulties OR patient did not have access to video capability.  We continued and completed visit with audio only.  Some vital signs may be absent or patient reported.   Time Spent with patient on telephone encounter: 25 minutes   Review of Systems     Cardiac Risk Factors include: advanced age (>31men, >29 women);hypertension     Objective:    Today's Vitals   10/15/20 1246  Weight: 154 lb (69.9 kg)  Height: 5\' 2"  (1.575 m)   Body mass index is 28.17 kg/m.  Advanced Directives 10/15/2020  Does Patient Have a Medical Advance Directive? Yes  Type of Paramedic of [place];Living will  Copy of [place] in Chart? No - copy requested    Current Medications (verified) Outpatient Encounter Medications as of 10/15/2020  Medication Sig  . BIOTIN 5000 PO Take by mouth.  . Cholecalciferol (VITAMIN D) 50 MCG (2000 UT) tablet Take 2,000 Units by mouth daily.  Marland Kitchen amLODipine (NORVASC) 5 MG tablet Take 1 tablet (5 mg total) by mouth daily.  . chlorhexidine (PERIDEX) 0.12 % solution  SMARTSIG:By Mouth (Patient not taking: Reported on 10/15/2020)  . hydrochlorothiazide (HYDRODIURIL) 25 MG tablet Take 1 tablet (25 mg total) by mouth daily.  . [DISCONTINUED] amoxicillin (AMOXIL) 500 MG capsule Take 500 mg by mouth 3 (three) times daily.   No facility-administered encounter medications on file as of 10/15/2020.    Allergies (verified) Patient has no known allergies.   History: Past Medical History:  Diagnosis Date  . Hypertension    Past Surgical History:  Procedure Laterality Date  . ABDOMINAL HYSTERECTOMY    . BREAST CYST EXCISION Left    unsure of year   . BREAST EXCISIONAL BIOPSY Left   . BREAST SURGERY    . COLON SURGERY     Family History  Problem Relation Age of Onset  . Hypertension Mother   . Cancer Father   . Diabetes Father   . Hypertension Father    Social History   Socioeconomic History  . Marital status: Married    Spouse name: Not on file  . Number of children: Not on file  . Years of education: Not on file  . Highest education level: Not on file  Occupational History  . Not on file  Tobacco Use  . Smoking status: Never Smoker  . Smokeless tobacco: Never Used  Vaping Use  . Vaping Use: Never used  Substance and Sexual Activity  . Alcohol use: Never  . Drug use: Never  . Sexual activity: Yes  Other Topics Concern  . Not on file  Social History Narrative  . Not on file   Social Determinants of Health   Financial  Resource Strain: Low Risk   . Difficulty of Paying Living Expenses: Not hard at all  Food Insecurity: No Food Insecurity  . Worried About Charity fundraiser in the Last Year: Never true  . Ran Out of Food in the Last Year: Never true  Transportation Needs: No Transportation Needs  . Lack of Transportation (Medical): No  . Lack of Transportation (Non-Medical): No  Physical Activity: Inactive  . Days of Exercise per Week: 0 days  . Minutes of Exercise per Session: 0 min  Stress: No Stress Concern Present  .  Feeling of Stress : Not at all  Social Connections: Moderately Integrated  . Frequency of Communication with Friends and Family: More than three times a week  . Frequency of Social Gatherings with Friends and Family: More than three times a week  . Attends Religious Services: Never  . Active Member of Clubs or Organizations: Yes  . Attends Archivist Meetings: 1 to 4 times per year  . Marital Status: Married    Tobacco Counseling Counseling given: Not Answered   Clinical Intake:  Pre-visit preparation completed: Yes  Pain : No/denies pain     Nutritional Status: BMI 25 -29 Overweight Nutritional Risks: None Diabetes: No  How often do you need to have someone help you when you read instructions, pamphlets, or other written materials from your doctor or pharmacy?: 1 - Never  Diabetic?No  Interpreter Needed?: No  Information entered by :: Caroleen Hamman LPN   Activities of Daily Living In your present state of health, do you have any difficulty performing the following activities: 10/15/2020  Hearing? N  Vision? N  Difficulty concentrating or making decisions? N  Walking or climbing stairs? N  Dressing or bathing? N  Doing errands, shopping? N  Preparing Food and eating ? N  Using the Toilet? N  In the past six months, have you accidently leaked urine? Y  Comment occasionally  Do you have problems with loss of bowel control? N  Managing your Medications? N  Managing your Finances? N  Housekeeping or managing your Housekeeping? N  Some recent data might be hidden    Patient Care Team: Ronnald Nian, DO as PCP - General (Family Medicine)  Indicate any recent Medical Services you may have received from other than [facility] providers in the past year (date may be approximate).     Assessment:   This is a routine wellness examination for [place].  Hearing/Vision screen  Hearing Screening   125Hz  250Hz  500Hz  1000Hz  2000Hz  3000Hz  4000Hz  6000Hz  8000Hz    Right ear:           Left ear:           Comments: No issues  Vision Screening Comments: Wears glasses Last eye exam-08/2020-Dr. Posey Pronto  Dietary issues and exercise activities discussed: Current Exercise Habits: The patient does not participate in regular exercise at present, Exercise limited by: None identified  Goals    . Patient Stated     Increase activity      Depression Screen PHQ 2/9 Scores 10/15/2020 01/18/2020 09/21/2019 08/29/2019  PHQ - 2 Score 0 0 0 0    Fall Risk Fall Risk  10/15/2020 01/18/2020 09/21/2019 08/29/2019  Falls in the past year? 0 0 0 0  Number falls in past yr: 0 0 - 0  Injury with Fall? 0 0 0 0  Follow up Falls prevention discussed - Falls evaluation completed Falls evaluation completed    [place]  TO THE HOME:  Any stairs in or around the home? No  Home free of loose throw rugs in walkways, pet beds, electrical cords, etc? Yes  Adequate lighting in your home to reduce risk of falls? Yes   ASSISTIVE DEVICES UTILIZED TO PREVENT FALLS:  Life alert? No  Use of a cane, walker or w/c? No  Grab bars in the bathroom? Yes  Shower chair or bench in shower? No  Elevated toilet seat or a handicapped toilet? No   TIMED UP AND GO:  Was the test performed? No . Phone visit   Cognitive Function:Normal cognitive status assessed by  this Nurse Health Advisor. No abnormalities found.          Immunizations Immunization History  Administered Date(s) Administered  . Fluad Quad(high Dose 65+) 06/20/2020  . PFIZER(Purple Top)SARS-COV-2 Vaccination 09/27/2019, 10/18/2019  . Pneumococcal Conjugate-13 10/28/2017  . Tdap 01/31/2018, 12/09/2018    TDAP status: Up to date  Flu Vaccine status: Up to date  Pneumococcal vaccine status: Due, Education has been provided regarding the importance of this vaccine. Advised may receive this vaccine at local pharmacy or Health Dept. Aware to provide a copy of the vaccination record if obtained  from local pharmacy or Health Dept. Verbalized acceptance and understanding.  Covid-19 vaccine status: Information provided on how to obtain vaccines. Booster due  Qualifies for Shingles Vaccine? Yes   Zostavax completed No   Shingrix Completed?: No.    Education has been provided regarding the importance of this vaccine. Patient has been advised to call insurance company to determine out of pocket expense if they have not yet received this vaccine. Advised may also receive vaccine at local pharmacy or Health Dept. Verbalized acceptance and understanding.  Screening Tests Health Maintenance  Topic Date Due  . PNA vac Low Risk Adult (2 of 2 - PPSV23) 10/29/2018  . COVID-19 Vaccine (3 - Pfizer risk 4-dose series) 11/15/2019  . INFLUENZA VACCINE  02/03/2021  . MAMMOGRAM  04/17/2022  . TETANUS/TDAP  12/08/2028  . COLONOSCOPY (Pts 45-69yrs Insurance coverage will need to be confirmed)  02/01/2030  . DEXA SCAN  Completed  . Hepatitis C Screening  Completed  . HPV VACCINES  Aged Out    Health Maintenance  Health Maintenance Due  Topic Date Due  . PNA vac Low Risk Adult (2 of 2 - PPSV23) 10/29/2018  . COVID-19 Vaccine (3 - Pfizer risk 4-dose series) 11/15/2019    Colorectal cancer screening: Type of screening: Colonoscopy. Completed 2018. Repeat every 10 years  Mammogram status: Completed Bilateral 04/17/2020. Repeat every year  Bone Density status: Completed 04/17/2020. Results reflect: Bone density results: OSTEOPENIA. Repeat every 2 years.  Lung Cancer Screening: (Low Dose CT Chest recommended if Age 69-80 years, 30 pack-year currently smoking OR have quit w/in 15years.) does not qualify.    Additional Screening:  Hepatitis C Screening:Completed 01/18/2020  Vision Screening: Recommended annual ophthalmology exams for early detection of glaucoma and other disorders of the eye. Is the patient up to date with their annual eye exam?  Yes  Who is the provider or what is the name of  the office in which the patient attends annual eye exams? Dr. Posey Pronto   Dental Screening: Recommended annual dental exams for proper oral hygiene  Community Resource Referral / Chronic Care Management: CRR required this visit?  No   CCM required this visit?  No      Plan:     I have personally reviewed and noted the following  in the patient's chart:   . Medical and social history . Use of alcohol, tobacco or illicit drugs  . Current medications and supplements . Functional ability and status . Nutritional status . Physical activity . Advanced directives . List of other physicians . Hospitalizations, surgeries, and ER visits in previous 12 months . Vitals . Screenings to include cognitive, depression, and falls . Referrals and appointments  In addition, I have reviewed and discussed with patient certain preventive protocols, quality metrics, and best practice recommendations. A written personalized care plan for preventive services as well as general preventive health recommendations were provided to patient.   Due to this being a telephonic visit, the after visit summary with patients personalized plan was offered to patient via mail or my-chart.  Patient would like to access on my-chart.   Marta Antu, LPN   6/80/3212  Nurse Health Advisor  Nurse Notes: None

## 2020-10-14 NOTE — Patient Instructions (Signed)
Patient instructed to contact the office promptly for new onset visual acuity decline or distortion

## 2020-10-14 NOTE — Assessment & Plan Note (Signed)
Will deliver laser retinopexy right eye today  The nature of retinal tears was discussed with the patient. Treatment options including laser retinopexy versus cryotherapy was discussed with the patient as well, as possible side effects including possible failure to prevent retinal detachment.  Other tears in other meridians or at the same site of repair could develop as the vitreous gel continues to contract and exert traction over time. All the patient's questions were answered. An informational note  was given to the patient.

## 2020-10-14 NOTE — Assessment & Plan Note (Addendum)
Laser retinopexy right eye for retinal break

## 2020-10-14 NOTE — Assessment & Plan Note (Signed)
RV 1 day for laser retinopexy left eye, currently not symptomatic

## 2020-10-14 NOTE — Progress Notes (Signed)
10/14/2020     CHIEF COMPLAINT Patient presents for Retina Evaluation (WIP - Retinal Tear OU - Ref'd by Dr. Nicki Reaper Groat//Pt reports new floaters and flashes OD x 1 day. Pt sts floaters OS "have always been there." Pt sts she was seeing intermittent curtain OD x 1 day, but is not currently seeing the curtain. Pt sts she has hx of laser retinopexy OS. )   HISTORY OF PRESENT ILLNESS: Samantha Brady is a 70 y.o. female who presents to the clinic today for:   HPI    Retina Evaluation    In right eye.  This started 1 day ago.  Duration of 1 day.  Associated Symptoms Flashes and Floaters.  Negative for Blind Spot.  Treatments tried include laser. Additional comments: WIP - Retinal Tear OU - Ref'd by Dr. Wyatt Portela  Pt reports new floaters and flashes OD x 1 day. Pt sts floaters OS "have always been there." Pt sts she was seeing intermittent curtain OD x 1 day, but is not currently seeing the curtain. Pt sts she has hx of laser retinopexy OS.        Last edited by Rockie Neighbours, [place] on 10/14/2020 11:07 AM. (History)      Referring physician: Ronnald Nian, DO [address] [place],  [place] [address]  HISTORICAL INFORMATION:   Selected notes from the [facility]: No current outpatient medications on file. (Ophthalmic Drugs)   No current facility-administered medications for this visit. (Ophthalmic Drugs)   Current Outpatient Medications (Other)  Medication Sig  . amLODipine (NORVASC) 5 MG tablet Take 1 tablet (5 mg total) by mouth daily.  Marland Kitchen amoxicillin (AMOXIL) 500 MG capsule Take 500 mg by mouth 3 (three) times daily.  Marland Kitchen BIOTIN 5000 PO Take by mouth.  . chlorhexidine (PERIDEX) 0.12 % solution SMARTSIG:By Mouth  . Cholecalciferol (VITAMIN D) 50 MCG (2000 UT) tablet Take 2,000 Units by mouth daily.  . hydrochlorothiazide (HYDRODIURIL) 25 MG tablet Take 1 tablet (25 mg total) by mouth daily.   No current facility-administered  medications for this visit. (Other)      REVIEW OF SYSTEMS:    ALLERGIES No Known Allergies  PAST MEDICAL HISTORY Past Medical History:  Diagnosis Date  . Hypertension    Past Surgical History:  Procedure Laterality Date  . ABDOMINAL HYSTERECTOMY    . BREAST CYST EXCISION Left    unsure of year   . BREAST EXCISIONAL BIOPSY Left   . BREAST SURGERY    . COLON SURGERY      FAMILY HISTORY Family History  Problem Relation Age of Onset  . Hypertension Mother   . Cancer Father   . Diabetes Father   . Hypertension Father     SOCIAL HISTORY Social History   Tobacco Use  . Smoking status: Never Smoker  . Smokeless tobacco: Never Used  Vaping Use  . Vaping Use: Never used  Substance Use Topics  . Alcohol use: Never  . Drug use: Never         OPHTHALMIC EXAM:  Base Eye Exam    Visual Acuity (ETDRS)      Right Left   Dist cc 20/20 20/25 +2   Correction: Glasses       Tonometry (Tonopen, 11:07 AM)      Right Left   Pressure 15 15       Pupils      Pupils Dark Light Shape React APD  Right PERRL 9 9 Round Dilated None   Left PERRL 9 9 Round Dilated None       Visual Fields (Counting fingers)      Left Right    Full Full       Extraocular Movement      Right Left    Full Full       Neuro/Psych    Oriented x3: Yes   Mood/Affect: Normal       Dilation    Both eyes: 1.0% Mydriacyl, 2.5% Phenylephrine @ 11:09 AM        Slit Lamp and Fundus Exam    External Exam      Right Left   External Normal Normal       Slit Lamp Exam      Right Left   Lids/Lashes Normal Normal   Conjunctiva/Sclera White and quiet White and quiet   Cornea Clear Clear   Anterior Chamber Deep and quiet Deep and quiet   Iris Round and reactive Round and reactive   Lens 2+ Nuclear sclerosis 2+ Nuclear sclerosis   Anterior Vitreous Normal Normal       Fundus Exam      Right Left   Posterior Vitreous Posterior vitreous detachment Posterior vitreous detachment    Disc Normal Normal   C/D Ratio 0.2 0.2   Macula Normal Normal   Vessels Normal Normal   Periphery operculated tear at 1:30, 2:30 HST pigment at 9,,,, HST at 8 with no pexy,,,,, old operculated break at 9:30, good pexy in this last region          IMAGING AND PROCEDURES  Imaging and Procedures for 10/14/20  Color Fundus Photography Optos - OU - Both Eyes       Right Eye Progression has no prior data. Disc findings include normal observations. Macula : normal observations. Vessels : normal observations. Periphery : tear.   Left Eye Progression has no prior data. Disc findings include normal observations. Macula : normal observations. Vessels : normal observations. Periphery : tear.   Notes OD, with horseshoe retinal tear superotemporal, with vitreous debris  OS with good retinopexy nasal, new retinal tear inferonasal, with no symptoms       Repair Retinal Breaks, Laser - OD - Right Eye       Tear locations include temporal, superior.   Time Out Confirmed correct patient, procedure, site, and patient consented.   Anesthesia Topical anesthesia was used. Anesthetic medications included Proparacaine 0.5%.   Laser Information The type of laser was diode. Color was yellow. The duration in seconds was 0.05. The spot size was 390 microns. Laser power was 300. Total spots was 98.   Post-op The patient tolerated the procedure well. There were no complications. The patient received written and verbal post procedure care education.   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 Treatment options including laser  retinopexy versus cryotherapy was discussed with the patient as well, as possible side effects including possible failure to prevent retinal detachment.  Other tears in other meridians or at the same site of repair could develop  as the vitreous gel continues to contract and exert traction over time. All the patient's questions were answered. An informational note  was given to the patient.  Posterior vitreous detachment, right Laser retinopexy right eye for retinal break      ICD-10-CM   1. 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 Posterior vitreous detachment, right  H43.811   4. Retinal round hole without detachment, left  H33.322     1.  Symptomatic retinal tear right eye, will deliver laser retinopexy today because of the new onset symptoms likely associated with tractional forces  2.  OS with asymptomatic retinal tear in an eye with previous retinal hole.  Will need laser retinopexy promptly, follow-up tomorrow after dilation left eye  3.  Ophthalmic Meds Ordered this visit:  No orders of the defined types were placed in this encounter.      Return in about 1 day (around 10/15/2020) for dilate, OS, RETINOPEXY.  Patient Instructions  Patient instructed to contact the office promptly for new onset visual acuity decline or distortion    Explained the diagnoses, plan, and follow up with the patient and they expressed understanding.  Patient expressed understanding of the importance of proper follow up care.   Clent Demark Sidda Humm M.D. Diseases & Surgery of the Retina and Vitreous Retina & Diabetic [place] 10/14/20     Abbreviations: M myopia (nearsighted); A astigmatism; H hyperopia (farsighted); P presbyopia; Mrx spectacle prescription;  CTL contact  lenses; OD right eye; OS left eye; OU both eyes  XT exotropia; ET esotropia; PEK punctate epithelial keratitis; PEE punctate epithelial erosions; DES dry eye syndrome; MGD meibomian gland dysfunction; ATs artificial tears; PFAT's preservative free artificial tears; [place] nuclear sclerotic cataract; PSC posterior subcapsular cataract; ERM epi-retinal membrane; PVD posterior vitreous detachment; RD retinal detachment; DM diabetes mellitus; DR diabetic retinopathy; NPDR non-proliferative diabetic retinopathy; PDR proliferative diabetic retinopathy; CSME clinically significant macular edema; DME diabetic macular edema; dbh dot blot hemorrhages; CWS cotton wool spot; POAG primary open angle glaucoma; C/D cup-to-disc ratio; HVF humphrey visual field; GVF goldmann visual field; OCT optical coherence tomography; IOP intraocular pressure; BRVO Branch retinal vein occlusion; CRVO central retinal vein occlusion; CRAO central retinal artery occlusion; BRAO branch retinal artery occlusion; RT retinal tear; SB scleral buckle; PPV pars plana vitrectomy; VH Vitreous hemorrhage; PRP panretinal laser photocoagulation; IVK intravitreal kenalog; VMT vitreomacular traction; MH Macular hole;  NVD neovascularization of the disc; NVE neovascularization elsewhere; AREDS age related eye disease study; ARMD age related macular degeneration; POAG primary open angle glaucoma; EBMD epithelial/anterior basement membrane dystrophy; ACIOL anterior chamber intraocular lens; IOL intraocular lens; PCIOL posterior chamber intraocular lens; Phaco/IOL phacoemulsification with intraocular lens placement; [place] photorefractive keratectomy; LASIK laser assisted in situ keratomileusis; HTN hypertension; DM diabetes mellitus; COPD chronic obstructive pulmonary disease

## 2020-10-15 ENCOUNTER — Ambulatory Visit (INDEPENDENT_AMBULATORY_CARE_PROVIDER_SITE_OTHER): Payer: Medicare Other | Admitting: Ophthalmology

## 2020-10-15 ENCOUNTER — Encounter (INDEPENDENT_AMBULATORY_CARE_PROVIDER_SITE_OTHER): Payer: Medicare Other | Admitting: Ophthalmology

## 2020-10-15 ENCOUNTER — Ambulatory Visit (INDEPENDENT_AMBULATORY_CARE_PROVIDER_SITE_OTHER): Payer: Medicare Other

## 2020-10-15 ENCOUNTER — Encounter (INDEPENDENT_AMBULATORY_CARE_PROVIDER_SITE_OTHER): Payer: Self-pay | Admitting: Ophthalmology

## 2020-10-15 VITALS — Ht 62.0 in | Wt 154.0 lb

## 2020-10-15 DIAGNOSIS — Z0000 Encounter for general adult medical examination without abnormal findings: Secondary | ICD-10-CM | POA: Diagnosis not present

## 2020-10-15 DIAGNOSIS — H33312 Horseshoe tear of retina without detachment, left eye: Secondary | ICD-10-CM | POA: Diagnosis not present

## 2020-10-15 NOTE — Assessment & Plan Note (Signed)
Is a retinopexy completed 8 o'clock position retinal break asymptomatic, follow-up in 8 weeks to monitor results of each

## 2020-10-15 NOTE — Progress Notes (Signed)
10/15/2020     CHIEF COMPLAINT Patient presents for Retina Follow Up (Pt here for 1 day retinopexy OS/Pt states, "My Va was very blurry for a long time last night and everything still looks the same.")   HISTORY OF PRESENT ILLNESS: Samantha Brady is a 70 y.o. female who presents to the clinic today for:   HPI    Retina Follow Up    Patient presents with  Retinal Break/Detachment.  In left eye. Additional comments: Pt here for 1 day retinopexy OS Pt states, "My Va was very blurry for a long time last night and everything still looks the same."       Last edited by Kendra Opitz, COA on 10/15/2020  3:34 PM. (History)      Referring physician: Ronnald Nian, DO [address] [place],  [place] [address]  HISTORICAL INFORMATION:   Selected notes from the [facility]: No current outpatient medications on file. (Ophthalmic Drugs)   No current facility-administered medications for this visit. (Ophthalmic Drugs)   Current Outpatient Medications (Other)  Medication Sig  . amLODipine (NORVASC) 5 MG tablet Take 1 tablet (5 mg total) by mouth daily.  Marland Kitchen BIOTIN 5000 PO Take by mouth.  . chlorhexidine (PERIDEX) 0.12 % solution SMARTSIG:By Mouth (Patient not taking: Reported on 10/15/2020)  . Cholecalciferol (VITAMIN D) 50 MCG (2000 UT) tablet Take 2,000 Units by mouth daily.  . hydrochlorothiazide (HYDRODIURIL) 25 MG tablet Take 1 tablet (25 mg total) by mouth daily.   No current facility-administered medications for this visit. (Other)      REVIEW OF SYSTEMS:    ALLERGIES No Known Allergies  PAST MEDICAL HISTORY Past Medical History:  Diagnosis Date  . Hypertension    Past Surgical History:  Procedure Laterality Date  . ABDOMINAL HYSTERECTOMY    . BREAST CYST EXCISION Left    unsure of year   . BREAST EXCISIONAL BIOPSY Left   . BREAST SURGERY    . COLON SURGERY      FAMILY HISTORY Family History  Problem  Relation Age of Onset  . Hypertension Mother   . Cancer Father   . Diabetes Father   . Hypertension Father     SOCIAL HISTORY Social History   Tobacco Use  . Smoking status: Never Smoker  . Smokeless tobacco: Never Used  Vaping Use  . Vaping Use: Never used  Substance Use Topics  . Alcohol use: Never  . Drug use: Never         OPHTHALMIC EXAM: Base Eye Exam    Visual Acuity (ETDRS)      Right Left   Dist cc 20/20 -2 20/20   Correction: Glasses       Tonometry (Tonopen, 3:36 PM)      Right Left   Pressure 17 14       Pupils      Pupils Dark Light Shape React APD   Right PERRL 3 2 Round Brisk None   Left PERRL 3 2 Round Brisk None       Visual Fields (Counting fingers)      Left Right    Full Full       Extraocular Movement      Right Left    Full Full       Neuro/Psych    Oriented x3: Yes   Mood/Affect: Normal       Dilation    Left eye: 1.0%  Mydriacyl, 2.5% Phenylephrine @ 3:35 PM        Slit Lamp and Fundus Exam    External Exam      Right Left   External Normal Normal       Slit Lamp Exam      Right Left   Lids/Lashes Normal Normal   Conjunctiva/Sclera White and quiet White and quiet   Cornea Clear Clear   Anterior Chamber Deep and quiet Deep and quiet   Iris Round and reactive Round and reactive   Lens 2+ Nuclear sclerosis 2+ Nuclear sclerosis   Anterior Vitreous Normal Normal          IMAGING AND PROCEDURES  Imaging and Procedures for 10/15/20  Repair Retinal Breaks, Laser - OS - Left Eye       Tear locations include nasal, inferior.   Time Out Confirmed correct patient, procedure, site, and patient consented.   Anesthesia Topical anesthesia was used. Anesthetic medications included Proparacaine 0.5%.   Laser Information The type of laser was diode. Color was yellow. The duration in seconds was 0.04. The spot size was 390 microns. Laser power was 280. Total spots was 202.   Post-op The patient tolerated the  procedure well. There were no complications. The patient received written and verbal post procedure care education.   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  Postop day #1 status post laser retinopexy right eye  2.  Laser retinopexy delivered OS today retinal ointment o'clock position  3.  Ophthalmic Meds Ordered this visit:  No orders of the defined types were placed in this encounter.      Return in about 8 weeks (around 12/10/2020) for DILATE OU, COLOR FP.  There are no Patient Instructions on file for this visit.   Explained the diagnoses, plan, and follow up with the patient and they expressed understanding.  Patient expressed understanding of the importance of proper follow up care.   Clent Demark Sundy Houchins M.D. Diseases & Surgery of the Retina and Vitreous Retina & Diabetic [place] 10/15/20     Abbreviations: M myopia (nearsighted); A astigmatism; H hyperopia (farsighted); P presbyopia; Mrx spectacle prescription;  CTL contact lenses; OD right eye; OS left eye; OU both eyes  XT exotropia; ET esotropia; PEK punctate epithelial keratitis; PEE punctate epithelial erosions; DES dry eye syndrome; MGD meibomian gland dysfunction; ATs artificial tears; PFAT's preservative free artificial tears; [place] nuclear sclerotic cataract; PSC posterior subcapsular cataract; ERM epi-retinal membrane; PVD posterior vitreous detachment; RD retinal detachment; DM diabetes mellitus; DR diabetic retinopathy; NPDR non-proliferative diabetic retinopathy; PDR proliferative diabetic retinopathy; CSME clinically  significant macular edema; DME diabetic macular edema; dbh dot blot hemorrhages; CWS cotton wool spot; POAG primary open angle glaucoma; C/D cup-to-disc ratio; HVF humphrey visual field; GVF goldmann visual field; OCT optical coherence tomography; IOP intraocular pressure; BRVO Branch retinal vein occlusion; CRVO central retinal vein occlusion; CRAO central retinal artery occlusion; BRAO branch retinal artery occlusion; RT retinal tear; SB scleral buckle; PPV pars plana vitrectomy; VH Vitreous hemorrhage; PRP panretinal laser  photocoagulation; IVK intravitreal kenalog; VMT vitreomacular traction; MH Macular hole;  NVD neovascularization of the disc; NVE neovascularization elsewhere; AREDS age related eye disease study; ARMD age related macular degeneration; POAG primary open angle glaucoma; EBMD epithelial/anterior basement membrane dystrophy; ACIOL anterior chamber intraocular lens; IOL intraocular lens; PCIOL posterior chamber intraocular lens; Phaco/IOL phacoemulsification with intraocular lens placement; PRK photorefractive keratectomy; LASIK laser assisted in situ keratomileusis; HTN hypertension; DM diabetes mellitus; COPD chronic obstructive pulmonary disease 

## 2020-10-15 NOTE — Patient Instructions (Signed)
[name] , Thank you for taking time to complete your Medicare Wellness Visit. I appreciate your ongoing commitment to your health goals. Please review the following plan we discussed and let me know if I can assist you in the future.   Screening recommendations/referrals: Colonoscopy: Completed 2018-Due 2028 Mammogram: Completed 04/17/2020-Due 04/17/2021 Bone Density: Completed 04/17/2020-Due 04/17/2022 Recommended yearly ophthalmology/optometry visit for glaucoma screening and checkup Recommended yearly dental visit for hygiene and checkup  Vaccinations: Influenza vaccine: Up to date Pneumococcal vaccine: Pneumovax-23 due-May obtain vaccine at our office or your local pharmacy Tdap vaccine: Up to date-Due 12/08/2028 Shingles vaccine: Discuss with pharmacy Covid-19:Booster due-May obtain at your local pharmacy  Advanced directives: Please bring a copy for your chart  Conditions/risks identified: See problem list  Next appointment: Follow up in one year for your annual wellness visit 10/21/2021 @ 3:45   Preventive Care 65 Years and Older, Female Preventive care refers to lifestyle choices and visits with your health care provider that can promote health and wellness. What does preventive care include?  A yearly physical exam. This is also called an annual well check.  Dental exams once or twice a year.  Routine eye exams. Ask your health care provider how often you should have your eyes checked.  Personal lifestyle choices, including:  Daily care of your teeth and gums.  Regular physical activity.  Eating a healthy diet.  Avoiding tobacco and drug use.  Limiting alcohol use.  Practicing safe sex.  Taking low-dose aspirin every day.  Taking vitamin and mineral supplements as recommended by your health care provider. What happens during an annual well check? The services and screenings done by your health care provider during your annual well check will depend on  your age, overall health, lifestyle risk factors, and family history of disease. Counseling  Your health care provider may ask you questions about your:  Alcohol use.  Tobacco use.  Drug use.  Emotional well-being.  Home and relationship well-being.  Sexual activity.  Eating habits.  History of falls.  Memory and ability to understand (cognition).  Work and work Statistician.  Reproductive health. Screening  You may have the following tests or measurements:  Height, weight, and BMI.  Blood pressure.  Lipid and cholesterol levels. These may be checked every 5 years, or more frequently if you are over 1 years old.  Skin check.  Lung cancer screening. You may have this screening every year starting at age 24 if you have a 30-pack-year history of smoking and currently smoke or have quit within the past 15 years.  Fecal occult blood test (FOBT) of the stool. You may have this test every year starting at age 58.  Flexible sigmoidoscopy or colonoscopy. You may have a sigmoidoscopy every 5 years or a colonoscopy every 10 years starting at age 63.  Hepatitis C blood test.  Hepatitis B blood test.  Sexually transmitted disease (STD) testing.  Diabetes screening. This is done by checking your blood sugar (glucose) after you have not eaten for a while (fasting). You may have this done every 1-3 years.  Bone density scan. This is done to screen for osteoporosis. You may have this done starting at age 15.  Mammogram. This may be done every 1-2 years. Talk to your health care provider about how often you should have regular mammograms. Talk with your health care provider about your test results, treatment options, and if necessary, the need for more tests. Vaccines  Your health care provider may recommend  certain vaccines, such as:  Influenza vaccine. This is recommended every year.  Tetanus, diphtheria, and acellular pertussis (Tdap, Td) vaccine. You may need a Td booster  every 10 years.  Zoster vaccine. You may need this after age 22.  Pneumococcal 13-valent conjugate (PCV13) vaccine. One dose is recommended after age 21.  Pneumococcal polysaccharide (PPSV23) vaccine. One dose is recommended after age 92. Talk to your health care provider about which screenings and vaccines you need and how often you need them. This information is not intended to replace advice given to you by your health care provider. Make sure you discuss any questions you have with your health care provider. Document Released: 07/19/2015 Document Revised: 03/11/2016 Document Reviewed: 04/23/2015 Elsevier Interactive Patient Education  2017 [employer] Prevention in the Home Falls can cause injuries. They can happen to people of all ages. There are many things you can do to make your home safe and to help prevent falls. What can I do on the outside of my home?  Regularly fix the edges of walkways and driveways and fix any cracks.  Remove anything that might make you trip as you walk through a door, such as a raised step or threshold.  Trim any bushes or trees on the path to your home.  Use bright outdoor lighting.  Clear any walking paths of anything that might make someone trip, such as rocks or tools.  Regularly check to see if handrails are loose or broken. Make sure that both sides of any steps have handrails.  Any raised decks and porches should have guardrails on the edges.  Have any leaves, snow, or ice cleared regularly.  Use sand or salt on walking paths during winter.  Clean up any spills in your garage right away. This includes oil or grease spills. What can I do in the bathroom?  Use night lights.  Install grab bars by the toilet and in the tub and shower. Do not use towel bars as grab bars.  Use non-skid mats or decals in the tub or shower.  If you need to sit down in the shower, use a plastic, non-slip stool.  Keep the floor dry. Clean up any  water that spills on the floor as soon as it happens.  Remove soap buildup in the tub or shower regularly.  Attach bath mats securely with double-sided non-slip rug tape.  Do not have throw rugs and other things on the floor that can make you trip. What can I do in the bedroom?  Use night lights.  Make sure that you have a light by your bed that is easy to reach.  Do not use any sheets or blankets that are too big for your bed. They should not hang down onto the floor.  Have a firm chair that has side arms. You can use this for support while you get dressed.  Do not have throw rugs and other things on the floor that can make you trip. What can I do in the kitchen?  Clean up any spills right away.  Avoid walking on wet floors.  Keep items that you use a lot in easy-to-reach places.  If you need to reach something above you, use a strong step stool that has a grab bar.  Keep electrical cords out of the way.  Do not use floor polish or wax that makes floors slippery. If you must use wax, use non-skid floor wax.  Do not have throw rugs and other  things on the floor that can make you trip. What can I do with my stairs?  Do not leave any items on the stairs.  Make sure that there are handrails on both sides of the stairs and use them. Fix handrails that are broken or loose. Make sure that handrails are as long as the stairways.  Check any carpeting to make sure that it is firmly attached to the stairs. Fix any carpet that is loose or worn.  Avoid having throw rugs at the top or bottom of the stairs. If you do have throw rugs, attach them to the floor with carpet tape.  Make sure that you have a light switch at the top of the stairs and the bottom of the stairs. If you do not have them, ask someone to add them for you. What else can I do to help prevent falls?  Wear shoes that:  Do not have high heels.  Have rubber bottoms.  Are comfortable and fit you well.  Are closed  at the toe. Do not wear sandals.  If you use a stepladder:  Make sure that it is fully opened. Do not climb a closed stepladder.  Make sure that both sides of the stepladder are locked into place.  Ask someone to hold it for you, if possible.  Clearly mark and make sure that you can see:  Any grab bars or handrails.  First and last steps.  Where the edge of each step is.  Use tools that help you move around (mobility aids) if they are needed. These include:  Canes.  Walkers.  Scooters.  Crutches.  Turn on the lights when you go into a dark area. Replace any light bulbs as soon as they burn out.  Set up your furniture so you have a clear path. Avoid moving your furniture around.  If any of your floors are uneven, fix them.  If there are any pets around you, be aware of where they are.  Review your medicines with your doctor. Some medicines can make you feel dizzy. This can increase your chance of falling. Ask your doctor what other things that you can do to help prevent falls. This information is not intended to replace advice given to you by your health care provider. Make sure you discuss any questions you have with your health care provider. Document Released: 04/18/2009 Document Revised: 11/28/2015 Document Reviewed: 07/27/2014 Elsevier Interactive Patient Education  2017 [employer].

## 2020-12-10 ENCOUNTER — Encounter (INDEPENDENT_AMBULATORY_CARE_PROVIDER_SITE_OTHER): Payer: Medicare Other | Admitting: Ophthalmology

## 2020-12-11 ENCOUNTER — Encounter (INDEPENDENT_AMBULATORY_CARE_PROVIDER_SITE_OTHER): Payer: Medicare Other | Admitting: Ophthalmology

## 2020-12-12 ENCOUNTER — Encounter (INDEPENDENT_AMBULATORY_CARE_PROVIDER_SITE_OTHER): Payer: Medicare Other | Admitting: Ophthalmology

## 2020-12-23 ENCOUNTER — Encounter (INDEPENDENT_AMBULATORY_CARE_PROVIDER_SITE_OTHER): Payer: Medicare Other | Admitting: Ophthalmology

## 2020-12-30 ENCOUNTER — Encounter (INDEPENDENT_AMBULATORY_CARE_PROVIDER_SITE_OTHER): Payer: Self-pay | Admitting: Ophthalmology

## 2020-12-30 ENCOUNTER — Ambulatory Visit (INDEPENDENT_AMBULATORY_CARE_PROVIDER_SITE_OTHER): Payer: Medicare Other | Admitting: Ophthalmology

## 2020-12-30 ENCOUNTER — Other Ambulatory Visit: Payer: Self-pay

## 2020-12-30 DIAGNOSIS — H33312 Horseshoe tear of retina without detachment, left eye: Secondary | ICD-10-CM

## 2020-12-30 DIAGNOSIS — H33311 Horseshoe tear of retina without detachment, right eye: Secondary | ICD-10-CM | POA: Diagnosis not present

## 2020-12-30 NOTE — Progress Notes (Signed)
12/30/2020     CHIEF COMPLAINT Patient presents for Retina Follow Up (11 Wk F/U OU, s/p laser retinopexy OU//Pt denies new symptoms OU. VA stable OU. Pt denies new floaters OU. Pt sts floaters have improved OU. No flashes of light reported OU.)   HISTORY OF PRESENT ILLNESS: Samantha Brady is a 70 y.o. female who presents to the clinic today for:   HPI     Retina Follow Up           Diagnosis: Other   Laterality: left eye   Onset: 11 weeks ago   Severity: mild   Duration: 11 weeks   Course: stable   Comments: 11 Wk F/U OU, s/p laser retinopexy OU  Pt denies new symptoms OU. VA stable OU. Pt denies new floaters OU. Pt sts floaters have improved OU. No flashes of light reported OU.       Last edited by Milly Jakob, [place] on 12/30/2020  9:47 AM.      Referring physician: Ronnald Nian, DO [place],  [place] [id number]  HISTORICAL INFORMATION:   Selected notes from the [facility]: No current outpatient medications on file. (Ophthalmic Drugs)   No current facility-administered medications for this visit. (Ophthalmic Drugs)   Current Outpatient Medications (Other)  Medication Sig   amLODipine (NORVASC) 5 MG tablet Take 1 tablet (5 mg total) by mouth daily.   BIOTIN 5000 PO Take by mouth.   chlorhexidine (PERIDEX) 0.12 % solution SMARTSIG:By Mouth (Patient not taking: Reported on 10/15/2020)   Cholecalciferol (VITAMIN D) 50 MCG (2000 UT) tablet Take 2,000 Units by mouth daily.   hydrochlorothiazide (HYDRODIURIL) 25 MG tablet Take 1 tablet (25 mg total) by mouth daily.   No current facility-administered medications for this visit. (Other)      REVIEW OF SYSTEMS:    ALLERGIES No Known Allergies  PAST MEDICAL HISTORY Past Medical History:  Diagnosis Date   Hypertension    Past Surgical History:  Procedure Laterality Date   ABDOMINAL HYSTERECTOMY     BREAST CYST EXCISION Left    unsure of year     BREAST EXCISIONAL BIOPSY Left    BREAST SURGERY     COLON SURGERY      FAMILY HISTORY Family History  Problem Relation Age of Onset   Hypertension Mother    Cancer Father    Diabetes Father    Hypertension Father     SOCIAL HISTORY Social History   Tobacco Use   Smoking status: Never   Smokeless tobacco: Never  Vaping Use   Vaping Use: Never used  Substance Use Topics   Alcohol use: Never   Drug use: Never         OPHTHALMIC EXAM:  Base Eye Exam     Visual Acuity (ETDRS)       Right Left   Dist cc 20/20 20/20    Correction: Glasses         Tonometry (Tonopen, 9:51 AM)       Right Left   Pressure 14 13         Pupils       Pupils Dark Light Shape React APD   Right PERRL 5 4 Round Brisk None   Left PERRL 5 4 Round Brisk None         Visual Fields (Counting fingers)       Left Right    Full Full  Extraocular Movement       Right Left    Full Full         Neuro/Psych     Oriented x3: Yes   Mood/Affect: Normal         Dilation     Both eyes: 1.0% Mydriacyl, 2.5% Phenylephrine @ 9:51 AM           Slit Lamp and Fundus Exam     External Exam       Right Left   External Normal Normal         Slit Lamp Exam       Right Left   Lids/Lashes Normal Normal   Conjunctiva/Sclera White and quiet White and quiet   Cornea Clear Clear   Anterior Chamber Deep and quiet Deep and quiet   Iris Round and reactive Round and reactive   Lens 2+ Nuclear sclerosis 2+ Nuclear sclerosis   Anterior Vitreous Normal Normal         Fundus Exam       Right Left   Posterior Vitreous Posterior vitreous detachment Posterior vitreous detachment   Disc Normal Normal   C/D Ratio 0.2 0.2   Macula Normal Normal   Vessels Normal Normal   Periphery operculated tear at 1:30, 2:30 HST with good retinopexy, no new breaks pigment at 9,,,, HST at 8 with no pexy, mostly well treated posterior, there is some anterior rim without  evidence of laser reaction at 8 meridian,,,,, old operculated break at 9:30, good pexy in this last region            IMAGING AND PROCEDURES  Imaging and Procedures for 12/30/20  Color Fundus Photography Optos - OU - Both Eyes       Right Eye Progression has no prior data. Disc findings include normal observations. Macula : normal observations. Vessels : normal observations. Periphery : tear.   Left Eye Progression has no prior data. Disc findings include normal observations. Macula : normal observations. Vessels : normal observations. Periphery : tear.   Notes OD, with horseshoe retinal tear superotemporal, with vitreous debris  OS with good retinopexy nasal, prior retinal tear inferonasal, with no symptoms, good retinopexy posteriorly, scant pigment at anterior border     Repair Retinal Breaks, Laser - OS - Left Eye       Tear locations include nasal, inferior.   Time Out Confirmed correct patient, procedure, site, and patient consented.   Anesthesia Topical anesthesia was used. Anesthetic medications included Proparacaine 0.5%.   Laser Information The type of laser was diode. Color was yellow. The duration in seconds was 0.04. The spot size was 390 microns. Laser power was 280. Total spots was 202.   Post-op The patient tolerated the procedure well. There were no complications. The patient received written and verbal post procedure care education.   Notes Posterior retinal break at 8:00 treated with 3 rows anteriorly, retinopexy no complications             ASSESSMENT/PLAN:  No problem-specific Assessment & Plan notes found for this encounter.      ICD-10-CM   1. 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 Retinal horseshoe tear without detachment, right  H33.311 Color Fundus Photography Optos - OU - Both Eyes      1.  OD with good  laser retinopexy no new  breaks  2.  OS, retinal tear superotemporal well treated, left eye with a horseshoe tear retraction with apparent anterior extension of the break well treated posteriorly on each lateral edge yet anterior rim therapy required  3.  Ophthalmic Meds Ordered this visit:  No orders of the defined types were placed in this encounter.      Return in 4 months (on 05/01/2021) for dilate, OS, COLOR FP.  There are no Patient Instructions on file for this visit.   Explained the diagnoses, plan, and follow up with the patient and they expressed understanding.  Patient expressed understanding of the importance of proper follow up care.   Clent Demark Anarely Nicholls M.D. Diseases & Surgery of the Retina and Vitreous Retina & Diabetic [place] 12/30/20     Abbreviations: M myopia (nearsighted); A astigmatism; H hyperopia (farsighted); P presbyopia; Mrx spectacle prescription;  CTL contact lenses; OD right eye; OS left eye; OU both eyes  XT exotropia; ET esotropia; PEK punctate epithelial keratitis; PEE punctate epithelial erosions; DES dry eye syndrome; MGD meibomian gland dysfunction; ATs artificial tears; PFAT's preservative free artificial tears; [place] nuclear sclerotic cataract; PSC posterior subcapsular cataract; ERM epi-retinal membrane; PVD posterior vitreous detachment; RD retinal detachment; DM diabetes mellitus; DR diabetic retinopathy; NPDR non-proliferative diabetic retinopathy; PDR proliferative diabetic retinopathy; CSME clinically significant macular edema; DME diabetic macular edema; dbh dot blot hemorrhages; CWS cotton wool spot; POAG primary open angle glaucoma; C/D cup-to-disc ratio; HVF humphrey visual field; GVF goldmann visual field; OCT optical coherence tomography; IOP intraocular pressure; BRVO Branch retinal vein occlusion; CRVO central retinal vein occlusion; CRAO central retinal artery occlusion; BRAO branch retinal artery occlusion; RT retinal tear; SB scleral  buckle; PPV pars plana vitrectomy; VH Vitreous hemorrhage; PRP panretinal laser photocoagulation; IVK intravitreal kenalog; VMT vitreomacular traction; MH Macular hole;  NVD neovascularization of the disc; NVE neovascularization elsewhere; AREDS age related eye disease study; ARMD age related macular degeneration; POAG primary open angle glaucoma; EBMD epithelial/anterior basement membrane dystrophy; ACIOL anterior chamber intraocular lens; IOL intraocular lens; PCIOL posterior chamber intraocular lens; Phaco/IOL phacoemulsification with intraocular lens placement; [place] photorefractive keratectomy; LASIK laser assisted in situ keratomileusis; HTN hypertension; DM diabetes mellitus; COPD chronic obstructive pulmonary disease

## 2021-01-31 ENCOUNTER — Encounter: Payer: Medicare Other | Admitting: Family Medicine

## 2021-05-05 ENCOUNTER — Ambulatory Visit (INDEPENDENT_AMBULATORY_CARE_PROVIDER_SITE_OTHER): Payer: Medicare Other | Admitting: Ophthalmology

## 2021-05-05 ENCOUNTER — Other Ambulatory Visit: Payer: Self-pay

## 2021-05-05 ENCOUNTER — Encounter (INDEPENDENT_AMBULATORY_CARE_PROVIDER_SITE_OTHER): Payer: Medicare Other | Admitting: Ophthalmology

## 2021-05-05 ENCOUNTER — Encounter (INDEPENDENT_AMBULATORY_CARE_PROVIDER_SITE_OTHER): Payer: Self-pay | Admitting: Ophthalmology

## 2021-05-05 DIAGNOSIS — H33322 Round hole, left eye: Secondary | ICD-10-CM

## 2021-05-05 DIAGNOSIS — H33311 Horseshoe tear of retina without detachment, right eye: Secondary | ICD-10-CM | POA: Diagnosis not present

## 2021-05-05 DIAGNOSIS — H33312 Horseshoe tear of retina without detachment, left eye: Secondary | ICD-10-CM | POA: Diagnosis not present

## 2021-05-05 NOTE — Progress Notes (Signed)
05/05/2021     CHIEF COMPLAINT Patient presents for  Chief Complaint  Patient presents with   Retina Follow Up    11 Wk F/U OU, s/p laser retinopexy OU  Pt denies new symptoms OU. VA stable OU. Pt denies new floaters OU. Pt sts floaters have improved OU. No flashes of light reported OU.      HISTORY OF PRESENT ILLNESS: Samantha Brady is a 70 y.o. female who presents to the clinic today for:   HPI     Retina Follow Up   Patient presents with  Other.  In left eye.  This started 4 months ago.  Severity is mild.  Duration of 4 months.  Since onset it is stable. Additional comments: 11 Wk F/U OU, s/p laser retinopexy OU  Pt denies new symptoms OU. VA stable OU. Pt denies new floaters OU. Pt sts floaters have improved OU. No flashes of light reported OU.        Comments   4 mos fu OS FP. Pt states "no change in vision but I have a slight pain in my right eye every once in a while, probably 2 months ago it started. I am going to say 3 days a week, not constant. It is a soreness that lasts maybe 10 seconds. Sometimes it will happen then happen again directly after and then it will go away. I have had floaters in my right eye starting maybe 3 months ago, not a lot of them." Denies new FOL.       Last edited by Laurin Coder on 05/05/2021 10:46 AM.      Referring physician: Ronnald Nian, DO No address on file  HISTORICAL INFORMATION:   Selected notes from the [facility]: No current outpatient medications on file. (Ophthalmic Drugs)   No current facility-administered medications for this visit. (Ophthalmic Drugs)   Current Outpatient Medications (Other)  Medication Sig   amLODipine (NORVASC) 5 MG tablet Take 1 tablet (5 mg total) by mouth daily.   BIOTIN 5000 PO Take by mouth.   chlorhexidine (PERIDEX) 0.12 % solution SMARTSIG:By Mouth (Patient not taking: Reported on 10/15/2020)   Cholecalciferol (VITAMIN D) 50 MCG (2000  UT) tablet Take 2,000 Units by mouth daily.   hydrochlorothiazide (HYDRODIURIL) 25 MG tablet Take 1 tablet (25 mg total) by mouth daily.   No current facility-administered medications for this visit. (Other)      REVIEW OF SYSTEMS:    ALLERGIES No Known Allergies  PAST MEDICAL HISTORY Past Medical History:  Diagnosis Date   Hypertension    Past Surgical History:  Procedure Laterality Date   ABDOMINAL HYSTERECTOMY     BREAST CYST EXCISION Left    unsure of year    BREAST EXCISIONAL BIOPSY Left    BREAST SURGERY     COLON SURGERY      FAMILY HISTORY Family History  Problem Relation Age of Onset   Hypertension Mother    Cancer Father    Diabetes Father    Hypertension Father     SOCIAL HISTORY Social History   Tobacco Use   Smoking status: Never   Smokeless tobacco: Never  Vaping Use   Vaping Use: Never used  Substance Use Topics   Alcohol use: Never   Drug use: Never         OPHTHALMIC EXAM:  Base Eye Exam     Visual Acuity (ETDRS)  Right Left   Dist cc 20/20 -1     Correction: Glasses         Tonometry (Tonopen, 10:49 AM)       Right Left   Pressure 10 12         Pupils       Pupils Dark Light Shape React APD   Right PERRL 5 4 Round Brisk None   Left PERRL 5 4 Round Brisk None         Extraocular Movement       Right Left    Full Full         Neuro/Psych     Oriented x3: Yes   Mood/Affect: Normal         Dilation     Both eyes: 1.0% Mydriacyl, 2.5% Phenylephrine @ 10:47 AM  Dilated OU due to patient complaint in right eye.          Slit Lamp and Fundus Exam     External Exam       Right Left   External Normal Normal         Slit Lamp Exam       Right Left   Lids/Lashes Normal Normal   Conjunctiva/Sclera White and quiet White and quiet   Cornea Clear Clear   Anterior Chamber Deep and quiet Deep and quiet   Iris Round and reactive Round and reactive   Lens 2+ Nuclear sclerosis 2+  Nuclear sclerosis   Anterior Vitreous Normal Normal         Fundus Exam       Right Left   Posterior Vitreous Posterior vitreous detachment Posterior vitreous detachment   Disc Normal Normal   C/D Ratio 0.2 0.2   Macula Normal Normal   Vessels Normal Normal   Periphery operculated tear at 1:30, 2:30 HST with good retinopexy, no new breaks, pigment at 7 o'clock ? traction, no break pigment at 9,,,, HST at 8 with good pexy, mostly well treated posterior, there is some anterior rim  evidence of laser reaction at 8 meridian,,,,, old operculated break at 9:30, good pexy in this last region            IMAGING AND PROCEDURES  Imaging and Procedures for 05/05/21  Color Fundus Photography Optos - OU - Both Eyes       Right Eye Progression has no prior data. Disc findings include normal observations. Macula : normal observations. Vessels : normal observations. Periphery : tear.   Left Eye Progression has no prior data. Disc findings include normal observations. Macula : normal observations. Vessels : normal observations. Periphery : tear.   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  No new retinal breaks.  Good retinopexy, will continue to watch pigmented area new finding at 7:30-8 position inferotemporally OD with traction but no break  Retinal round hole without detachment, left Good retinopexy no new retinal breaks      ICD-10-CM   1. 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 Retinal round hole without detachment, left  H33.322  1.  2.  3.  Ophthalmic Meds Ordered this visit:  No orders of the defined types were placed in this encounter.      Return in about 5 months (around 10/03/2021) for DILATE OU, COLOR FP, OCT.  There are no Patient Instructions on file for this visit.   Explained the diagnoses, plan, and follow up with the patient and they expressed understanding.  Patient expressed understanding of the importance of proper follow up care.   Samantha Brady Booher M.D. Diseases & Surgery of the Retina and Vitreous Retina & Diabetic Comfort 05/05/21     Abbreviations: M myopia (nearsighted); A astigmatism; H hyperopia (farsighted); P presbyopia; Mrx spectacle prescription;  CTL contact lenses; OD right eye; OS left eye; OU both eyes  XT exotropia; ET esotropia; PEK punctate epithelial keratitis; PEE punctate epithelial erosions; DES dry eye syndrome; MGD meibomian gland dysfunction; ATs artificial tears; PFAT's preservative free artificial tears; [place] nuclear sclerotic cataract; PSC posterior subcapsular cataract; ERM epi-retinal membrane; PVD posterior vitreous detachment; RD retinal detachment; DM diabetes mellitus; DR diabetic retinopathy; NPDR non-proliferative diabetic retinopathy; PDR proliferative diabetic retinopathy; CSME clinically significant macular edema; DME diabetic macular edema; dbh dot blot hemorrhages; CWS cotton wool spot; POAG primary open angle glaucoma; C/D cup-to-disc ratio; HVF humphrey visual field; GVF goldmann visual field; OCT optical coherence tomography; IOP intraocular pressure; BRVO Branch retinal vein occlusion; CRVO central retinal vein occlusion; CRAO central retinal artery occlusion; BRAO branch retinal artery occlusion; RT retinal tear; SB scleral buckle; PPV pars plana vitrectomy; VH Vitreous hemorrhage; PRP panretinal laser photocoagulation; IVK intravitreal kenalog; VMT vitreomacular traction; MH Macular hole;  NVD neovascularization of the disc; NVE neovascularization  elsewhere; AREDS age related eye disease study; ARMD age related macular degeneration; POAG primary open angle glaucoma; EBMD epithelial/anterior basement membrane dystrophy; ACIOL anterior chamber intraocular lens; IOL intraocular lens; PCIOL posterior chamber intraocular lens; Phaco/IOL phacoemulsification with intraocular lens placement; [place] photorefractive keratectomy; LASIK laser assisted in situ keratomileusis; HTN hypertension; DM diabetes mellitus; COPD chronic obstructive pulmonary disease

## 2021-05-05 NOTE — Assessment & Plan Note (Addendum)
Good retinopexy inferotemporal.  No new retinal breaks.  Good retinopexy, will continue to watch pigmented area new finding at 7:30-8 position inferotemporally OD with traction but no break

## 2021-05-05 NOTE — Assessment & Plan Note (Signed)
Good retinopexy no new retinal breaks

## 2021-05-05 NOTE — Assessment & Plan Note (Deleted)
Good retinopexy, will continue to watch pigmented area new finding at 7:38 position inferotemporally OD with traction but no break

## 2021-08-27 ENCOUNTER — Other Ambulatory Visit: Payer: Self-pay | Admitting: Internal Medicine

## 2021-08-27 DIAGNOSIS — N644 Mastodynia: Secondary | ICD-10-CM

## 2021-09-02 ENCOUNTER — Encounter (INDEPENDENT_AMBULATORY_CARE_PROVIDER_SITE_OTHER): Payer: Medicare Other | Admitting: Ophthalmology

## 2021-09-15 ENCOUNTER — Ambulatory Visit: Payer: Self-pay

## 2021-09-15 ENCOUNTER — Ambulatory Visit
Admission: RE | Admit: 2021-09-15 | Discharge: 2021-09-15 | Disposition: A | Discharge status: Home / Self Care | Payer: Medicare Other | Source: Ambulatory Visit | Attending: Internal Medicine | Admitting: Internal Medicine

## 2021-09-15 ENCOUNTER — Other Ambulatory Visit: Payer: Self-pay

## 2021-09-15 DIAGNOSIS — N644 Mastodynia: Secondary | ICD-10-CM

## 2021-10-02 ENCOUNTER — Encounter (INDEPENDENT_AMBULATORY_CARE_PROVIDER_SITE_OTHER): Payer: Medicare Other | Admitting: Ophthalmology

## 2021-10-13 ENCOUNTER — Encounter (INDEPENDENT_AMBULATORY_CARE_PROVIDER_SITE_OTHER): Payer: Self-pay | Admitting: Ophthalmology

## 2021-10-13 ENCOUNTER — Other Ambulatory Visit: Payer: Self-pay

## 2021-10-13 ENCOUNTER — Ambulatory Visit (INDEPENDENT_AMBULATORY_CARE_PROVIDER_SITE_OTHER): Payer: Medicare Other | Admitting: Ophthalmology

## 2021-10-13 DIAGNOSIS — H2513 Age-related nuclear cataract, bilateral: Secondary | ICD-10-CM | POA: Diagnosis not present

## 2021-10-13 DIAGNOSIS — H33312 Horseshoe tear of retina without detachment, left eye: Secondary | ICD-10-CM | POA: Diagnosis not present

## 2021-10-13 DIAGNOSIS — H33311 Horseshoe tear of retina without detachment, right eye: Secondary | ICD-10-CM | POA: Diagnosis not present

## 2021-10-13 NOTE — Assessment & Plan Note (Deleted)
OS inferotemporal, horseshoe tear with persistent vitreal retinal traction has extended its tractional elevation anterior to previous laser demarcation.  The posterior aspect of the area still well treated. ? ?The persistent traction poses a risk for repeat development and possibly a detachment thus we will need to treat anterior edge of the new tractional horseshoe tear expansion left eye. ? ?Follow-up this week dilate left eye only laser retinopexy left eye ?

## 2021-10-13 NOTE — Assessment & Plan Note (Signed)
OS inferotemporal, horseshoe tear with persistent vitreal retinal traction has extended its tractional elevation anterior to previous laser demarcation.  The posterior aspect of the area still well treated. ? ?The persistent traction poses a risk for repeat development and possibly a detachment thus we will need to treat anterior edge of the new tractional horseshoe tear expansion left eye. ? ?Follow-up this week dilate left eye only laser retinopexy left eye ?

## 2021-10-13 NOTE — Assessment & Plan Note (Signed)
Moderate OU, not visually impactful ?

## 2021-10-13 NOTE — Progress Notes (Signed)
? ? ?10/13/2021 ? ?  ? ?CHIEF COMPLAINT ?Patient presents for  ?Chief Complaint  ?Patient presents with  ? Retina Follow Up  ? ? ? ? ?HISTORY OF PRESENT ILLNESS: ?Samantha Brady is a 71 y.o. female who presents to the clinic today for:  ? ?HPI   ? ? Retina Follow Up   ? ?      ? Diagnosis: Other  ? Laterality: left eye  ? Severity: moderate  ? Course: stable  ? ?  ?  ? ? Comments   ?5 mos fu ou oct. ?Pt stated no changes in vision. ?Pt sees FOL mostly at night in peripheral and then its gone. Pt sees floaters in both eyes, mostly In the left eye. ? ? ? ?  ?  ?Last edited by Silvestre Moment on 10/13/2021  8:53 AM.  ?  ? ? ?Referring physician: ?No referring provider defined for this encounter. ? ?HISTORICAL INFORMATION:  ? ?Selected notes from the [facility] ?  ?   ? ?CURRENT MEDICATIONS: ?No current outpatient medications on file. (Ophthalmic Drugs)  ? ?No current facility-administered medications for this visit. (Ophthalmic Drugs)  ? ?Current Outpatient Medications (Other)  ?Medication Sig  ? amLODipine (NORVASC) 5 MG tablet Take 1 tablet (5 mg total) by mouth daily.  ? BIOTIN 5000 PO Take by mouth.  ? chlorhexidine (PERIDEX) 0.12 % solution SMARTSIG:By Mouth (Patient not taking: Reported on 10/15/2020)  ? Cholecalciferol (VITAMIN D) 50 MCG (2000 UT) tablet Take 2,000 Units by mouth daily.  ? hydrochlorothiazide (HYDRODIURIL) 25 MG tablet Take 1 tablet (25 mg total) by mouth daily.  ? ?No current facility-administered medications for this visit. (Other)  ? ? ? ? ?REVIEW OF SYSTEMS: ?ROS   ?Negative for: Constitutional, Gastrointestinal, Neurological, Skin, Genitourinary, Musculoskeletal, HENT, Endocrine, Cardiovascular, Eyes, Respiratory, Psychiatric, Allergic/Imm, Heme/Lymph ?Last edited by Silvestre Moment on 10/13/2021  8:53 AM.  ?  ? ? ? ?ALLERGIES ?No Known Allergies ? ?PAST MEDICAL HISTORY ?Past Medical History:  ?Diagnosis Date  ? Hypertension   ? ?Past Surgical History:  ?Procedure Laterality Date  ? ABDOMINAL  HYSTERECTOMY    ? BREAST CYST EXCISION Left   ? unsure of year   ? BREAST EXCISIONAL BIOPSY Left   ? BREAST SURGERY    ? COLON SURGERY    ? ? ?FAMILY HISTORY ?Family History  ?Problem Relation Age of Onset  ? Hypertension Mother   ? Cancer Father   ? Diabetes Father   ? Hypertension Father   ? ? ?SOCIAL HISTORY ?Social History  ? ?Tobacco Use  ? Smoking status: Never  ? Smokeless tobacco: Never  ?Vaping Use  ? Vaping Use: Never used  ?Substance Use Topics  ? Alcohol use: Never  ? Drug use: Never  ? ?  ? ?  ? ?OPHTHALMIC EXAM: ? ?Base Eye Exam   ? ? Visual Acuity (ETDRS)   ? ?   Right Left  ? Dist cc 20/30 -1 +2 20/20  ? ? Correction: Glasses  ? ?  ?  ? ? Tonometry (Tonopen, 8:58 AM)   ? ?   Right Left  ? Pressure 16 18  ? ?  ?  ? ? Pupils   ? ?   Pupils APD  ? Right PERRL None  ? Left PERRL None  ? ?  ?  ? ? Visual Fields   ? ?   Left Right  ?  Full Full  ? ?  ?  ? ? Extraocular  Movement   ? ?   Right Left  ?  Full Full  ? ?  ?  ? ? Neuro/Psych   ? ? Oriented x3: Yes  ? Mood/Affect: Normal  ? ?  ?  ? ? Dilation   ? ? Both eyes: 1.0% Mydriacyl, 2.5% Phenylephrine @ 8:58 AM  ? ?  ?  ? ?  ? ?Slit Lamp and Fundus Exam   ? ? External Exam   ? ?   Right Left  ? External Normal Normal  ? ?  ?  ? ? Slit Lamp Exam   ? ?   Right Left  ? Lids/Lashes Normal Normal  ? Conjunctiva/Sclera White and quiet White and quiet  ? Cornea Clear Clear  ? Anterior Chamber Deep and quiet Deep and quiet  ? Iris Round and reactive Round and reactive  ? Lens 2+ Nuclear sclerosis 2+ Nuclear sclerosis  ? Anterior Vitreous Normal Normal  ? ?  ?  ? ? Fundus Exam   ? ?   Right Left  ? Posterior Vitreous Posterior vitreous detachment Posterior vitreous detachment, , Central vitreous floaters  ? Disc Normal Normal  ? C/D Ratio 0.2 0.2  ? Macula Normal Normal  ? Vessels Normal Normal  ? Periphery operculated tear at 1:30, 2:30 HST with good retinopexy, no new breaks, pigment at 7 o'clock ? traction, no break pigment at 10, HST at 8 with good pexy,  mostly well treated posterior, there is extension of tear beyond previous laser retinopexy at the anterior edge due to ongoing traction, some anterior rim  evidence of laser reaction at 8 meridian, old operculated break at 9:30, good pexy in this last region  ? ?  ?  ? ?  ? ? ?IMAGING AND PROCEDURES  ?Imaging and Procedures for 10/13/21 ? ?OCT, Retina - OU - Both Eyes   ? ?   ?Right Eye ?Quality was good. Central Foveal Thickness: 297. Progression has no prior data. Findings include epiretinal membrane.  ? ?Left Eye ?Quality was good. Scan locations included subfoveal. Central Foveal Thickness: 270. Progression has no prior data. Findings include epiretinal membrane.  ? ?  ? ? ?  ?  ? ?  ?ASSESSMENT/PLAN: ? 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  The posterior aspect of the area still well treated. ? ?The persistent traction poses a risk for repeat development and possibly a detachment thus we will need to treat anterior edge of the new tractional horseshoe tear expansion left eye. ? ?Follow-up this week dilate left eye only laser retinopexy left eye ? ?Nuclear sclerotic cataract of both eyes ?Moderate OU, not visually impactful ?  ? ?  ICD-10-CM   ?1. 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  ?2. Retinal horseshoe tear without detachment, right  H33.311   ?  ?3. Nuclear sclerotic cataract of both eyes  H25.13   ?  ? ? ?1.  OS will require further laser retinopexy for extension of anterior tractional tear beyond previous retinopexy seen inferonasally ?2. ? ?3. ? ?Ophthalmic Meds Ordered this visit:  ?No orders of the defined types were placed in this encounter. ? ? ?  ? ?Return in about 1 day (around 10/14/2021), or In the afternoon, for COLOR FP, less than 1 week, dilate, OS, RETINOPEXY. ? ?There are no  Patient Instructions  on file for this visit. ? ? ?Explained the diagnoses, plan, and follow up with the patient and they expressed understanding.  Patient expressed understanding of the importance of proper follow up care.  ? ?Clent Demark. Maneh Sieben M.D. ?Diseases & Surgery of the Retina and Vitreous ?[facility] ?10/13/21 ? ? ? ? ?Abbreviations: ?M myopia (nearsighted); A astigmatism; H hyperopia (farsighted); P presbyopia; Mrx spectacle prescription;  CTL contact lenses; OD right eye; OS left eye; OU both eyes  XT exotropia; ET esotropia; PEK punctate epithelial keratitis; PEE punctate epithelial erosions; DES dry eye syndrome; MGD meibomian gland dysfunction; ATs artificial tears; PFAT's preservative free artificial tears; [place] nuclear sclerotic cataract; PSC posterior subcapsular cataract; ERM epi-retinal membrane; PVD posterior vitreous detachment; RD retinal detachment; DM diabetes mellitus; DR diabetic retinopathy; NPDR non-proliferative diabetic retinopathy; PDR proliferative diabetic retinopathy; CSME clinically significant macular edema; DME diabetic macular edema; dbh dot blot hemorrhages; CWS cotton wool spot; POAG primary open angle glaucoma; C/D cup-to-disc ratio; HVF humphrey visual field; GVF goldmann visual field; OCT optical coherence tomography; IOP intraocular pressure; BRVO Branch retinal vein occlusion; CRVO central retinal vein occlusion; CRAO central retinal artery occlusion; BRAO branch retinal artery occlusion; RT retinal tear; SB scleral buckle; PPV pars plana vitrectomy; VH Vitreous hemorrhage; PRP panretinal laser photocoagulation; IVK intravitreal kenalog; VMT vitreomacular traction; MH Macular hole;  NVD neovascularization of the disc; NVE neovascularization elsewhere; AREDS age related eye disease study; ARMD age related macular degeneration; POAG primary open angle glaucoma; EBMD epithelial/anterior basement membrane dystrophy; ACIOL anterior chamber intraocular lens; IOL  intraocular lens; PCIOL posterior chamber intraocular lens; Phaco/IOL phacoemulsification with intraocular lens placement; PRK photorefractive keratectomy; LASIK laser assisted in situ keratomileusis; HTN hypertension; DM

## 2021-10-14 ENCOUNTER — Encounter (INDEPENDENT_AMBULATORY_CARE_PROVIDER_SITE_OTHER): Payer: PRIVATE HEALTH INSURANCE | Admitting: Ophthalmology

## 2021-10-14 ENCOUNTER — Ambulatory Visit (INDEPENDENT_AMBULATORY_CARE_PROVIDER_SITE_OTHER): Payer: Medicare Other | Admitting: Ophthalmology

## 2021-10-14 ENCOUNTER — Encounter (INDEPENDENT_AMBULATORY_CARE_PROVIDER_SITE_OTHER): Payer: Self-pay | Admitting: Ophthalmology

## 2021-10-14 DIAGNOSIS — H33312 Horseshoe tear of retina without detachment, left eye: Secondary | ICD-10-CM | POA: Diagnosis not present

## 2021-10-14 NOTE — Progress Notes (Signed)
? ? ?10/14/2021 ? ?  ? ?CHIEF COMPLAINT ?Patient presents for  ?Chief Complaint  ?Patient presents with  ? Retina Follow Up  ? ? ? ? ?HISTORY OF PRESENT ILLNESS: ?Samantha Brady is a 71 y.o. female who presents to the clinic today for:  ? ?HPI   ? ? Retina Follow Up   ? ?      ? Diagnosis: Other  ? Laterality: left eye  ? Severity: moderate  ? Course: stable  ? ?  ?  ? ? Comments   ?1 day or in the afternoon, for COLOR FP, Dilate OS, RETINOPEXY. ?Pt stated no changes in vision. ?Pt denies new floaters and FOL. ? ? ? ?  ?  ?Last edited by Silvestre Moment on 10/14/2021  2:02 PM.  ?  ? ? ?Referring physician: ?Michael Boston, MD ?[address] ?[place],  [place] [address] ? ?HISTORICAL INFORMATION:  ? ?Selected notes from the [facility] ?  ?   ? ?CURRENT MEDICATIONS: ?No current outpatient medications on file. (Ophthalmic Drugs)  ? ?No current facility-administered medications for this visit. (Ophthalmic Drugs)  ? ?Current Outpatient Medications (Other)  ?Medication Sig  ? amLODipine (NORVASC) 5 MG tablet Take 1 tablet (5 mg total) by mouth daily.  ? BIOTIN 5000 PO Take by mouth.  ? chlorhexidine (PERIDEX) 0.12 % solution SMARTSIG:By Mouth (Patient not taking: Reported on 10/15/2020)  ? Cholecalciferol (VITAMIN D) 50 MCG (2000 UT) tablet Take 2,000 Units by mouth daily.  ? hydrochlorothiazide (HYDRODIURIL) 25 MG tablet Take 1 tablet (25 mg total) by mouth daily.  ? ?No current facility-administered medications for this visit. (Other)  ? ? ? ? ?REVIEW OF SYSTEMS: ?ROS   ?Negative for: Constitutional, Gastrointestinal, Neurological, Skin, Genitourinary, Musculoskeletal, HENT, Endocrine, Cardiovascular, Eyes, Respiratory, Psychiatric, Allergic/Imm, Heme/Lymph ?Last edited by Silvestre Moment on 10/14/2021  2:02 PM.  ?  ? ? ? ?ALLERGIES ?No Known Allergies ? ?PAST MEDICAL HISTORY ?Past Medical History:  ?Diagnosis Date  ? Hypertension   ? ?Past Surgical History:  ?Procedure Laterality Date  ? ABDOMINAL HYSTERECTOMY    ? BREAST CYST EXCISION  Left   ? unsure of year   ? BREAST EXCISIONAL BIOPSY Left   ? BREAST SURGERY    ? COLON SURGERY    ? ? ?FAMILY HISTORY ?Family History  ?Problem Relation Age of Onset  ? Hypertension Mother   ? Cancer Father   ? Diabetes Father   ? Hypertension Father   ? ? ?SOCIAL HISTORY ?Social History  ? ?Tobacco Use  ? Smoking status: Never  ? Smokeless tobacco: Never  ?Vaping Use  ? Vaping Use: Never used  ?Substance Use Topics  ? Alcohol use: Never  ? Drug use: Never  ? ?  ? ?  ? ?OPHTHALMIC EXAM: ? ?Base Eye Exam   ? ? Visual Acuity (ETDRS)   ? ?   Right Left  ? Dist cc 20/20 20/20 -1  ? ? Correction: Glasses  ? ?  ?  ? ? Tonometry (Tonopen, 2:07 PM)   ? ?   Right Left  ? Pressure 18 21  ? ?  ?  ? ? Pupils   ? ?   Pupils APD  ? Right PERRL None  ? Left PERRL None  ? ?  ?  ? ? Visual Fields   ? ?   Left Right  ?  Full Full  ? ?  ?  ? ? Extraocular Movement   ? ?  Right Left  ?  Full Full  ? ?  ?  ? ? Neuro/Psych   ? ? Oriented x3: Yes  ? Mood/Affect: Normal  ? ?  ?  ? ? Dilation   ? ? Left eye: 2.5% Phenylephrine, 1.0% Mydriacyl @ 2:07 PM  ? ?  ?  ? ?  ? ?Slit Lamp and Fundus Exam   ? ? External Exam   ? ?   Right Left  ? External Normal Normal  ? ?  ?  ? ? Slit Lamp Exam   ? ?   Right Left  ? Lids/Lashes Normal Normal  ? Conjunctiva/Sclera White and quiet White and quiet  ? Cornea Clear Clear  ? Anterior Chamber Deep and quiet Deep and quiet  ? Iris Round and reactive Round and reactive  ? Lens 2+ Nuclear sclerosis 2+ Nuclear sclerosis  ? Anterior Vitreous Normal Normal  ? ?  ?  ? ? Fundus Exam   ? ?   Right Left  ? Posterior Vitreous Posterior vitreous detachment Posterior vitreous detachment, , Central vitreous floaters  ? Disc Normal Normal  ? C/D Ratio 0.2 0.2  ? Macula Normal Normal  ? Vessels Normal Normal  ? Periphery operculated tear at 1:30, 2:30 HST with good retinopexy, no new breaks, pigment at 7 o'clock ? traction, no break pigment at 10, HST at 8 with good pexy, mostly well treated posterior, there is  extension of tear beyond previous laser retinopexy at the anterior edge due to ongoing traction, some anterior rim  evidence of laser reaction at 8 meridian, old operculated break at 9:30, good pexy in this last region  ? ?  ?  ? ?  ? ? ?IMAGING AND PROCEDURES  ?Imaging and Procedures for 10/14/21 ? ?Color Fundus Photography Optos - OU - Both Eyes   ? ?   ?Right Eye ?Progression has no prior data. Disc findings include normal observations. Macula : normal observations. Vessels : normal observations. Periphery : tear.  ? ?Left Eye ?Progression has no prior data. Disc findings include normal observations. Macula : normal observations. Vessels : normal observations. Periphery : tear.  ? ?Notes ?OD, with horseshoe retinal tear superotemporal, with vitreous debris ? ?OS with good retinopexy nasal, prior retinal tear nasal and inferonasal, with no symptoms, good retinopexy posteriorly, scant pigment at anterior border ? ?  ? ?Repair Retinal Breaks, Laser - OS - Left Eye   ? ?   ?Tear locations include inferior, nasal.  ? ?Time Out ?Confirmed correct patient, procedure, site, and patient consented.  ? ?Anesthesia ?Topical anesthesia was used. Anesthetic medications included Proparacaine 0.5%.  ? ?Laser Information ?The type of laser was diode. Color was yellow. The duration in seconds was 0.04. The spot size was 390 microns. Laser power was 300. Total spots was 194.  ? ?Post-op ?The patient tolerated the procedure well. There were no complications. The patient received written and verbal post procedure care education.  ? ?Notes ?Anterior rim of horseshoe tear treated successfully with good laser reaction centered at 8 meridian inferonasal ? ?  ? ? ?  ?  ? ?  ?ASSESSMENT/PLAN: ? 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 ?OS with progression of horseshoe tear through the anterior laser retinopexy component of treatment prior history. ? ?  Anterior rim of horseshoe tear treated successfully with good laser reaction to day centered at 8 meridian inferonasal  ? ?  ICD-10-CM   ?1. 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  Repair Retinal Breaks, Laser - OS - Left Eye  ?  CANCELED: Pneumatic Retinopexy - OS - Left Eye  ?  ? ? ?1.  OS laser retinopexy applied to retinal tear which is extended previous and outside of area of treatment in centimeter ?Region inferonasal no complication ? ?2. ? ?3. ? ?Ophthalmic Meds Ordered this visit:  ?No orders of the defined types were placed in this encounter. ? ? ?  ? ?Return in about 4 months (around 02/13/2022) for DILATE OU, COLOR FP. ? ?There are no Patient Instructions on file for this visit. ? ? ?Explained the diagnoses, plan, and follow up with the patient and they expressed understanding.  Patient expressed understanding of the importance of proper follow up care.  ? ?Clent Demark. Eliav Mechling M.D. ?Diseases & Surgery of the Retina and Vitreous ?[place] ?10/14/21 ? ? ? ? ?Abbreviations: ?M myopia (nearsighted); A astigmatism; H hyperopia (farsighted); P presbyopia; Mrx spectacle prescription;  CTL contact lenses; OD right eye; OS left eye; OU both eyes  XT exotropia; ET esotropia; PEK punctate epithelial keratitis; PEE punctate epithelial erosions; DES dry eye syndrome; MGD meibomian gland dysfunction; ATs artificial tears; PFAT's preservative free artificial tears; [place] nuclear sclerotic cataract; PSC posterior subcapsular cataract; ERM epi-retinal membrane; PVD posterior vitreous detachment; RD retinal detachment; DM diabetes mellitus; DR diabetic retinopathy; NPDR non-proliferative diabetic retinopathy; PDR proliferative diabetic retinopathy; CSME clinically significant  macular edema; DME diabetic macular edema; dbh dot blot hemorrhages; CWS cotton wool spot; POAG primary open angle glaucoma; C/D cup-to-disc ratio; HVF humphrey visual field; GVF goldmann visual field; OCT optical

## 2021-10-14 NOTE — Assessment & Plan Note (Addendum)
Horseshoe retinal break centered at 8:00, with tractional change extending anterior to previous laser retinopexy will attempt to add additional laser retinopexy to this region today to prevent further progression or risk of retinal detachment ? ?OS with progression of horseshoe tear through the anterior laser retinopexy component of treatment prior history. ? ?Anterior rim of horseshoe tear treated successfully with good laser reaction to day centered at 8 meridian inferonasal ?

## 2021-10-21 ENCOUNTER — Ambulatory Visit: Payer: Medicare Other

## 2022-02-05 ENCOUNTER — Ambulatory Visit: Payer: Medicare Other | Admitting: Cardiology

## 2022-02-05 ENCOUNTER — Encounter: Payer: Self-pay | Admitting: Cardiology

## 2022-02-05 ENCOUNTER — Inpatient Hospital Stay: Payer: Medicare Other

## 2022-02-05 VITALS — BP 149/74 | HR 77 | Temp 97.3°F | Resp 16 | Ht 62.0 in | Wt 138.6 lb

## 2022-02-05 DIAGNOSIS — R55 Syncope and collapse: Secondary | ICD-10-CM

## 2022-02-05 DIAGNOSIS — I10 Essential (primary) hypertension: Secondary | ICD-10-CM

## 2022-02-05 DIAGNOSIS — M8580 Other specified disorders of bone density and structure, unspecified site: Secondary | ICD-10-CM
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
## 2022-02-09 ENCOUNTER — Ambulatory Visit: Payer: Medicare Other | Admitting: Cardiology

## 2022-02-09 ENCOUNTER — Ambulatory Visit: Payer: Self-pay

## 2022-02-09 DIAGNOSIS — R55 Syncope and collapse: Secondary | ICD-10-CM

## 2022-02-09 NOTE — Progress Notes (Signed)
Extended EKG monitor replaced

## 2022-02-16 ENCOUNTER — Ambulatory Visit (INDEPENDENT_AMBULATORY_CARE_PROVIDER_SITE_OTHER): Payer: Medicare Other | Admitting: Ophthalmology

## 2022-02-16 ENCOUNTER — Encounter (INDEPENDENT_AMBULATORY_CARE_PROVIDER_SITE_OTHER): Payer: Self-pay | Admitting: Ophthalmology

## 2022-02-16 DIAGNOSIS — H2513 Age-related nuclear cataract, bilateral: Secondary | ICD-10-CM

## 2022-02-16 DIAGNOSIS — H33311 Horseshoe tear of retina without detachment, right eye: Secondary | ICD-10-CM

## 2022-02-16 DIAGNOSIS — H33322 Round hole, left eye: Secondary | ICD-10-CM

## 2022-02-16 DIAGNOSIS — H43811 Vitreous degeneration, right eye: Secondary | ICD-10-CM

## 2022-02-16 DIAGNOSIS — H33312 Horseshoe tear of retina without detachment, left eye: Secondary | ICD-10-CM | POA: Diagnosis not present

## 2022-02-16 NOTE — Progress Notes (Signed)
02/16/2022     CHIEF COMPLAINT Patient presents for  Chief Complaint  Patient presents with   Flashes/floaters      HISTORY OF PRESENT ILLNESS: Samantha Brady. Mcalpine is a 71 y.o. female who presents to the clinic today for:   HPI     Flashes/floaters           Laterality: both eyes   Onset: years ago   Duration: Intermittant   Quality: small         Comments   4 mths dilate ou color fp Pt states her vision has been stable Pt denies any new floaters or FOL Pt states she is seeing a little double vision in her left eye when reading the chart  OU with retinopexy for breaks each eye nasally in the past doing well now no new symptoms      Last edited by Hurman Horn, MD on 02/16/2022  2:41 PM.      Referring physician: Delsa Sale, OD [address],  [place] [address]  HISTORICAL INFORMATION:   Selected notes from the [place]: No current outpatient medications on file. (Ophthalmic Drugs)   No current facility-administered medications for this visit. (Ophthalmic Drugs)   Current Outpatient Medications (Other)  Medication Sig   BIOTIN 5000 PO Take by mouth.   Cholecalciferol (VITAMIN D) 50 MCG (2000 UT) tablet Take 2,000 Units by mouth daily.   hydrochlorothiazide (HYDRODIURIL) 25 MG tablet Take 1 tablet (25 mg total) by mouth daily.   No current facility-administered medications for this visit. (Other)      REVIEW OF SYSTEMS: ROS   Negative for: Constitutional, Gastrointestinal, Neurological, Skin, Genitourinary, Musculoskeletal, HENT, Endocrine, Cardiovascular, Eyes, Respiratory, Psychiatric, Allergic/Imm, Heme/Lymph Last edited by Orene Desanctis D, CMA on 02/16/2022  2:12 PM.       ALLERGIES No Known Allergies  PAST MEDICAL HISTORY Past Medical History:  Diagnosis Date   Hypertension    Osteopenia    Past Surgical History:  Procedure Laterality Date   ABDOMINAL HYSTERECTOMY     BREAST  CYST EXCISION Left    unsure of year    BREAST EXCISIONAL BIOPSY Left    BREAST SURGERY     COLON SURGERY      FAMILY HISTORY Family History  Problem Relation Age of Onset   Hypertension Mother    Dementia Mother    Cancer Father    Diabetes Father    Hypertension Father    Seizures Sister     SOCIAL HISTORY Social History   Tobacco Use   Smoking status: Never   Smokeless tobacco: Never  Vaping Use   Vaping Use: Never used  Substance Use Topics   Alcohol use: Never   Drug use: Never         OPHTHALMIC EXAM:  Base Eye Exam     Visual Acuity (ETDRS)       Right Left   Dist [place] 20/20 20/20    Correction: Glasses         Tonometry (Tonopen, 2:19 PM)       Right Left   Pressure 10 12         Pupils       Shape   Right Round   Left          Visual Fields       Left Right    Full Full  Extraocular Movement       Right Left    Ortho Ortho    -- -- --  --  --  -- -- --   -- -- --  --  --  -- -- --           Neuro/Psych     Oriented x3: Yes   Mood/Affect: Normal         Dilation     Both eyes: 1.0% Mydriacyl, 2.5% Phenylephrine @ 2:13 PM           Slit Lamp and Fundus Exam     External Exam       Right Left   External Normal Normal         Slit Lamp Exam       Right Left   Lids/Lashes Normal Normal   Conjunctiva/Sclera White and quiet White and quiet   Cornea Clear Clear   Anterior Chamber Deep and quiet Deep and quiet   Iris Round and reactive Round and reactive   Lens 2+ Nuclear sclerosis 2+ Nuclear sclerosis   Anterior Vitreous Normal Normal         Fundus Exam       Right Left   Posterior Vitreous Posterior vitreous detachment Posterior vitreous detachment, , Central vitreous floaters   Disc Normal Normal   C/D Ratio 0.2 0.2   Macula Normal Normal   Vessels Normal Normal   Periphery operculated tear at 1:30, 2:30 HST with good retinopexy, no new breaks, pigment at 7 o'clock ?  traction, no break pigment at 10, HST at 8 with good pexy, mostly well treated posterior, there is extension of tear beyond previous laser retinopexy at the anterior edge due to ongoing traction, some anterior rim  evidence of laser reaction at 8 meridian, old operculated break at 9:30, good pexy in this last region            IMAGING AND PROCEDURES  Imaging and Procedures for 02/16/22  Color Fundus Photography Optos - OU - Both Eyes       Clear media OU.  Superonasal retinal break in the right eye good retinopexy is visualized  OS 6 nasal retinopexy also visualized for retinal break no new breaks OU             ASSESSMENT/PLAN:  Nuclear sclerotic cataract of both eyes The nature of cataract was discussed with the patient as well as the elective nature of surgery. The patient was reassured that surgery at a later date does not put the patient at risk for a worse outcome. It was emphasized that the need for surgery is dictated by the patient's quality of life as influenced by the cataract. Patient was instructed to maintain close follow up with their general eye care doctor.  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 Nuclear sclerotic cataract of both eyes  H25.13     3. Retinal round hole without detachment, left  H33.322     4. 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 Posterior vitreous detachment, right  H43.811       1.  OU doing very well.  No new signs of retinal tears or detachment in either eye  2.  3.  Ophthalmic Meds Ordered this visit:  No orders of the defined types were placed in this  encounter.      Return in about 1 year (around 02/17/2023) for DILATE OU, COLOR FP, OCT.  There are no Patient Instructions on file for this visit.   Explained the diagnoses, plan, and follow up with the patient and they expressed understanding.  Patient expressed understanding of the importance of proper follow up care.   Clent Demark Lexani Corona M.D. Diseases & Surgery of the Retina and Vitreous [facility] [place] 02/16/22     Abbreviations: M myopia (nearsighted); A astigmatism; H hyperopia (farsighted); P presbyopia; Mrx spectacle prescription;  CTL contact lenses; OD right eye; OS left eye; OU both eyes  XT exotropia; ET esotropia; PEK punctate epithelial keratitis; PEE punctate epithelial erosions; DES dry eye syndrome; MGD meibomian gland dysfunction; ATs artificial tears; PFAT's preservative free artificial tears; [place] nuclear sclerotic cataract; PSC posterior subcapsular cataract; ERM epi-retinal membrane; PVD posterior vitreous detachment; RD retinal detachment; DM diabetes mellitus; DR diabetic retinopathy; NPDR non-proliferative diabetic retinopathy; PDR proliferative diabetic retinopathy; CSME clinically significant macular edema; DME diabetic macular edema; dbh dot blot hemorrhages; CWS cotton wool spot; POAG primary open angle glaucoma; C/D cup-to-disc ratio; HVF humphrey visual field; GVF goldmann visual field; OCT optical coherence tomography; IOP intraocular pressure; BRVO Branch retinal vein occlusion; CRVO central retinal vein occlusion; CRAO central retinal artery occlusion; BRAO branch retinal artery occlusion; RT retinal tear; SB scleral buckle; PPV pars plana vitrectomy; VH Vitreous hemorrhage; PRP panretinal laser photocoagulation; IVK intravitreal kenalog; VMT vitreomacular traction; MH Macular hole;  NVD neovascularization of the disc; NVE neovascularization elsewhere; AREDS age related eye disease study; ARMD age related macular degeneration; POAG primary open angle  glaucoma; EBMD epithelial/anterior basement membrane dystrophy; ACIOL anterior chamber intraocular lens; IOL intraocular lens; PCIOL posterior chamber intraocular lens; Phaco/IOL phacoemulsification with intraocular lens placement; [place] photorefractive keratectomy; LASIK laser assisted in situ keratomileusis; HTN hypertension; DM diabetes mellitus; COPD chronic obstructive pulmonary disease

## 2022-02-16 NOTE — Assessment & Plan Note (Signed)
Completed OS not likely to trigger another tear

## 2022-02-16 NOTE — Assessment & Plan Note (Addendum)
No new retinal breaks stable OD

## 2022-02-16 NOTE — Assessment & Plan Note (Signed)
OS doing well good laser retinopexy

## 2022-02-16 NOTE — Assessment & Plan Note (Signed)

## 2022-03-19 ENCOUNTER — Ambulatory Visit: Payer: Medicare Other

## 2022-03-19 DIAGNOSIS — R55 Syncope and collapse: Secondary | ICD-10-CM

## 2022-03-20 ENCOUNTER — Ambulatory Visit: Payer: Medicare Other

## 2022-03-20 DIAGNOSIS — R55 Syncope and collapse: Secondary | ICD-10-CM

## 2022-03-27 NOTE — Progress Notes (Signed)
Called pt to inform her about the message pt understood

## 2022-04-02 ENCOUNTER — Encounter: Payer: Self-pay | Admitting: Cardiology

## 2022-04-02 ENCOUNTER — Ambulatory Visit: Payer: Medicare Other | Admitting: Cardiology

## 2022-04-02 VITALS — BP 132/75 | HR 54 | Temp 97.4°F | Resp 16 | Ht 62.0 in | Wt 140.2 lb

## 2022-04-02 DIAGNOSIS — I10 Essential (primary) hypertension: Secondary | ICD-10-CM

## 2022-04-02 DIAGNOSIS — R55 Syncope and collapse: Secondary | ICD-10-CM

## 2022-04-02 DIAGNOSIS — I493 Ventricular premature depolarization: Secondary | ICD-10-CM

## 2022-04-02 MED ORDER — DILTIAZEM HCL ER COATED BEADS 120 MG PO CP24: 120.0000 mg | ORAL_CAPSULE | Freq: Every day | ORAL | 0 refills | Status: DC

## 2022-04-02 NOTE — Progress Notes (Signed)
ID:  Samantha Brady. Samantha, Brady Nov 23, 1950, MRN [id number]  PCP:  Samantha Boston, MD  Cardiologist:  Samantha Kras, DO, [facility] (established care 02/05/2022)  Date: 04/02/22 Last Office Visit: 02/05/2022  Chief Complaint  Patient presents with   Syncope    Results   Follow-up    HPI  Samantha Brady. Axe is a 71 y.o. Caucasian female whose past medical history and cardiovascular risk factors include: Premature ventricular contractions, HTN, osteopenia, postmenopausal female, advanced age.  Patient was referred to the practice for evaluation of near syncope/syncope.  In June 2023 when she was traveling to [place] for upcoming cruise she was in the bathroom taking a shower under warm water without any prodromal symptoms had a syncopal event.  She had fell on the bathroom floor and her friends accompanied her and assisted her.  She regained consciousness within 2 seconds or so and did not sustain any significant injury and she continued on her cruise.  In July 2023 she had a near syncopal event and because of these 2 instances was referred to cardiology for further evaluation and management.  At the last office visit she was recommended to undergo echocardiogram to evaluate for structural heart disease, GXT to evaluate for functional capacity and exercise-induced arrhythmia/ischemia, and cardiac monitor.  She is here for follow-up.  Has not had any recurrences of syncope.  Denies anginal discomfort or heart failure symptoms.  FUNCTIONAL STATUS: Walks three days every other week 45-60 mins each time.    ALLERGIES: No Known Allergies  MEDICATION LIST PRIOR TO VISIT: Current Meds  Medication Sig   BIOTIN 5000 PO Take by mouth.   Cholecalciferol (VITAMIN D) 50 MCG (2000 UT) tablet Take 2,000 Units by mouth daily.   diltiazem (CARDIZEM CD) 120 MG 24 hr capsule Take 1 capsule (120 mg total) by mouth daily at 10 pm.     PAST MEDICAL HISTORY: Past Medical History:  Diagnosis Date   Hypertension     Osteopenia    Premature ventricular contraction     PAST SURGICAL HISTORY: Past Surgical History:  Procedure Laterality Date   ABDOMINAL HYSTERECTOMY     BREAST CYST EXCISION Left    unsure of year    BREAST EXCISIONAL BIOPSY Left    BREAST SURGERY     COLON SURGERY      FAMILY HISTORY: The patient family history includes Cancer in her father; Dementia in her mother; Diabetes in her father; Hypertension in her father and mother; Seizures in her sister.  SOCIAL HISTORY:  The patient  reports that she has never smoked. She has never used smokeless tobacco. She reports that she does not drink alcohol and does not use drugs.  REVIEW OF SYSTEMS: Review of Systems  Cardiovascular:  Negative for chest pain, claudication, dyspnea on exertion, irregular heartbeat, leg swelling, near-syncope, orthopnea, palpitations, paroxysmal nocturnal dyspnea and syncope.  Respiratory:  Negative for shortness of breath.   Hematologic/Lymphatic: Negative for bleeding problem.  Musculoskeletal:  Negative for muscle cramps and myalgias.  Neurological:  Negative for dizziness and light-headedness.    PHYSICAL EXAM:    04/02/2022   10:02 AM 02/05/2022    9:28 AM 10/15/2020   12:46 PM  Vitals with BMI  Height '5\' 2"'$  '5\' 2"'$  '5\' 2"'$   Weight 140 lbs 3 oz 138 lbs 10 oz 154 lbs  BMI 25.64 35.00 93.81  Systolic 829 937   Diastolic 75 74   Pulse 54 77     Physical Exam  Constitutional: She  appears healthy.  Neck: No JVD present.  Cardiovascular: Normal rate, regular rhythm, S1 normal, S2 normal, intact distal pulses and normal pulses. Occasional extrasystoles are present. Exam reveals no gallop, no S3 and no S4.  No murmur heard. Pulmonary/Chest: Effort normal. No stridor. She has no wheezes. She has no rales.  Abdominal: Soft. Bowel sounds are normal. She exhibits no distension. There is no abdominal tenderness.  Musculoskeletal:        General: No edema.  Neurological: She is alert and oriented to person,  place, and time. She has intact cranial nerves (2-12).  Skin: Skin is warm and moist.    CARDIAC DATABASE: EKG: 02/05/2022: NSR, 71 bpm, without underlying injury pattern, consider old anteroseptal infarct.   Echocardiogram: 03/19/2022:  Normal LV systolic function with visual EF 55-60%. Left ventricle cavity is normal in size. Normal left ventricular wall thickness. Normal global wall motion. Normal diastolic filling pattern, normal LAP. Calculated EF 55%.  Structurally normal tricuspid valve with trace regurgitation. No evidence of pulmonary hypertension.  No prior available for comparison.   Stress Testing: Exercise treadmill stress test 03/20/2022: Exercise treadmill stress test performed using Bruce protocol.  Patient reached 5.9 METS, and 90% of age predicted maximum heart rate.  Exercise capacity was low.  No chest pain reported.  Normal heart rate and hemodynamic response. Stress EKG revealed no ischemic changes.  Frequent multifocal PVC's noted during rest and stress. Recommend clinical correlation.   Heart Catheterization: None  Cardiac monitor (Zio Patch): Dominant rhythm sinus. Heart rate 42-162 bpm.  Avg HR 78 bpm. No atrial fibrillation, supraventricular tachycardia,  high grade AV block. 1 asymptomatic episode of NSVT, 4 beats, 2.3 seconds duration, max HR 162 bpm.  1 asymptomatic episode of sinus pause 3.2 seconds duration, on 02/20/2022 at 2 AM. Total ventricular ectopic burden 4.4%. Total supraventricular ectopic burden <1%. Patient triggered events: 1.  Underlying rhythm normal sinus.  LABORATORY DATA: Collected 01/16/2022 provided by PCP Total cholesterol 202, triglycerides 63, HDL 64, LDL 124, non-HDL 137. TSH 1.67 Hemoglobin 14.1 g/dL, hematocrit 41.1% Sodium 142, potassium 3.7, chloride 104, bicarb 31, BUN 15, creatinine 0.7 AST 17, ALT 16, alkaline phosphatase 79  C   IMPRESSION:    ICD-10-CM   1. Premature ventricular contraction  I49.3 diltiazem  (CARDIZEM CD) 120 MG 24 hr capsule    2. Syncope and collapse  R55     3. Essential hypertension  I10        RECOMMENDATIONS: Samantha Brady. Samantha Brady is a 71 y.o. Caucasian female whose past medical history and cardiac risk factors include: Premature ventricular contractions, HTN, osteopenia, postmenopausal female, advanced age.  Referred to the practice for evaluation of near syncope/syncope.  She has undergone appropriate cardiovascular testing including echocardiogram, GXT, and cardiac monitor.  Results of these diagnostic tests reviewed with the patient at today's visit.  Echocardiogram notes preserved LVEF without any significant valvular heart disease, GXT noted to have PVCs during majority of her study but stress ECG overall nonischemic.  Cardiac monitor also illustrates underlying rhythm to be sinus, 1 auto triggered event and NSVT, 1 nocturnal episode of pause of 3.2 seconds, and a PVC burden of approximately 4.4%.  Her home blood pressure log reviewed and scanned into the media section for reference.  Her ambulatory blood pressure monitoring is also noted possible irregular beats which she is documented as asterixis.  She has done well with down titration of HCTZ from 25 to 12.5 mg p.o. daily.  I have asked her to  take hydrochlorothiazide in the morning and will start diltiazem in the evening.  No additional cardiovascular testing warranted at this time.  Like to see her back in 6 months or sooner if change in clinical status  FINAL MEDICATION LIST END OF ENCOUNTER: Meds ordered this encounter  Medications   diltiazem (CARDIZEM CD) 120 MG 24 hr capsule    Sig: Take 1 capsule (120 mg total) by mouth daily at 10 pm.    Dispense:  90 capsule    Refill:  0    There are no discontinued medications.    Current Outpatient Medications:    BIOTIN 5000 PO, Take by mouth., Disp: , Rfl:    Cholecalciferol (VITAMIN D) 50 MCG (2000 UT) tablet, Take 2,000 Units by mouth daily., Disp: , Rfl:     diltiazem (CARDIZEM CD) 120 MG 24 hr capsule, Take 1 capsule (120 mg total) by mouth daily at 10 pm., Disp: 90 capsule, Rfl: 0   hydrochlorothiazide (HYDRODIURIL) 25 MG tablet, Take 1 tablet (25 mg total) by mouth daily. (Patient taking differently: Take 12.5 mg by mouth every morning.), Disp: 90 tablet, Rfl: 3  No orders of the defined types were placed in this encounter.   There are no Patient Instructions on file for this visit.   --Continue cardiac medications as reconciled in final medication list. --Return in about 6 months (around 10/01/2022) for Follow up PVCs. or sooner if needed. --Continue follow-up with your primary care physician regarding the management of your other chronic comorbid conditions.  Patient's questions and concerns were addressed to her satisfaction. She voices understanding of the instructions provided during this encounter.   This note was created using a voice recognition software as a result there may be grammatical errors inadvertently enclosed that do not reflect the nature of this encounter. Every attempt is made to correct such errors.  Samantha Brady, [place], [facility]  Pager: [phone] Office: [phone]

## 2022-06-25 ENCOUNTER — Other Ambulatory Visit: Payer: Self-pay | Admitting: Cardiology

## 2022-06-25 DIAGNOSIS — I493 Ventricular premature depolarization: Secondary | ICD-10-CM

## 2022-07-21 ENCOUNTER — Ambulatory Visit (INDEPENDENT_AMBULATORY_CARE_PROVIDER_SITE_OTHER): Payer: Medicare Other | Admitting: Obstetrics and Gynecology

## 2022-07-21 ENCOUNTER — Encounter: Payer: Self-pay | Admitting: Obstetrics and Gynecology

## 2022-07-21 VITALS — BP 135/88 | HR 86 | Ht 61.89 in | Wt 144.6 lb

## 2022-07-21 DIAGNOSIS — R3129 Other microscopic hematuria: Secondary | ICD-10-CM

## 2022-07-21 DIAGNOSIS — N393 Stress incontinence (female) (male): Secondary | ICD-10-CM | POA: Diagnosis not present

## 2022-07-21 DIAGNOSIS — R350 Frequency of micturition: Secondary | ICD-10-CM

## 2022-07-21 LAB — POCT URINALYSIS DIPSTICK
Bilirubin, UA: NEGATIVE
Glucose, UA: NEGATIVE
Ketones, UA: NEGATIVE
Leukocytes, UA: NEGATIVE
Nitrite, UA: NEGATIVE
Protein, UA: NEGATIVE
Spec Grav, UA: 1.015 (ref 1.010–1.025)
Urobilinogen, UA: 0.2 E.U./dL
pH, UA: 5.5 (ref 5.0–8.0)

## 2022-07-21 NOTE — Addendum Note (Signed)
Addended by: Elita Quick on: 07/21/2022 10:22 AM   Modules accepted: Orders

## 2022-07-21 NOTE — Progress Notes (Signed)
[facility] New Patient Evaluation and Consultation  Referring Provider: Michael Boston, MD PCP: Michael Boston, MD Date of Service: 07/21/2022  SUBJECTIVE Chief Complaint: New Patient (Initial Visit) Samantha Brady is a 72 y.o. female here for a consult for stress incontinence.)  History of Present Illness: Samantha Brady is a 72 y.o. White or Caucasian female seen in consultation at the request of Dr. Jacalyn Lefevre for evaluation of incontinence.     Urinary Symptoms: Leaks urine with cough/ sneeze, laughing, exercise, and lifting Leaks 3 time(s) per day. Can be large or small depending on how full bladder is.  Pad use:  liners/ mini-pads  She is bothered by her UI symptoms. Has not had any prior treatment previously.   Day time voids 5.  Nocturia: 1 times per night to void. Voiding dysfunction: she empties her bladder well.  does not use a catheter to empty bladder.  When urinating, she feels the need to urinate multiple times in a row  UTIs:  0  UTI's in the last year.   Denies history of blood in urine and kidney or bladder stones  Pelvic Organ Prolapse Symptoms:                  She Denies a feeling of a bulge the vaginal area.   Bowel Symptom: Bowel movements: 2 time(s) per day Stool consistency: hard Straining: no.  Splinting: no.  Incomplete evacuation: no.  She Denies accidental bowel leakage / fecal incontinence Bowel regimen: none  Sexual Function Sexually active: no.    Pelvic Pain Denies pelvic pain   Past Medical History:  Past Medical History:  Diagnosis Date   Hypertension    Osteopenia    Premature ventricular contraction      Past Surgical History:   Past Surgical History:  Procedure Laterality Date   ABDOMINAL HYSTERECTOMY     BREAST CYST EXCISION Left    unsure of year    BREAST EXCISIONAL BIOPSY Left    BREAST SURGERY     COLON RESECTION     pre cancerous tumor     Past OB/GYN History: OB History  Gravida Para Term  Preterm AB Living  '2 2 2     2  '$ SAB IAB Ectopic Multiple Live Births          2    # Outcome Date GA Lbr Len/2nd Weight Sex Delivery Anes PTL Lv  2 Term      Vag-Spont     1 Term      Vag-Spont       S/p hysterectomy   Medications: She has a current medication list which includes the following prescription(s): biotin, vitamin d, diltiazem, and hydrochlorothiazide.   Allergies: Patient has No Known Allergies.   Social History:  Social History   Tobacco Use   Smoking status: Never   Smokeless tobacco: Never  Vaping Use   Vaping Use: Never used  Substance Use Topics   Alcohol use: Not Currently    Comment: occationally   Drug use: Never    Relationship status: widowed She lives alone.   She is not employed. Regular exercise: Yes: sometimes History of abuse: No  Family History:   Family History  Problem Relation Age of Onset   Hypertension Mother    Dementia Mother    Cancer Father    Diabetes Father    Hypertension Father    Seizures Sister      Review of Systems: Review of  Systems  Constitutional:  Negative for fever, malaise/fatigue and weight loss.  Respiratory:  Negative for cough, shortness of breath and wheezing.   Cardiovascular:  Negative for chest pain, palpitations and leg swelling.  Gastrointestinal:  Negative for abdominal pain and blood in stool.  Genitourinary:  Negative for dysuria.  Musculoskeletal:  Negative for myalgias.  Skin:  Negative for rash.  Neurological:  Negative for dizziness and headaches.  Endo/Heme/Allergies:  Does not bruise/bleed easily.  Psychiatric/Behavioral:  Negative for depression. The patient is not nervous/anxious.      OBJECTIVE Physical Exam: Vitals:   07/21/22 0844  BP: 135/88  Pulse: 86  Weight: 144 lb 9.6 oz (65.6 kg)  Height: 5' 1.89" (1.572 m)    Physical Exam Constitutional:      General: She is not in acute distress. Pulmonary:     Effort: Pulmonary effort is normal.  Abdominal:     General:  There is no distension.     Palpations: Abdomen is soft.     Tenderness: There is no abdominal tenderness. There is no rebound.  Musculoskeletal:        General: No swelling. Normal range of motion.  Skin:    General: Skin is warm and dry.     Findings: No rash.  Neurological:     Mental Status: She is alert and oriented to person, place, and time.  Psychiatric:        Mood and Affect: Mood normal.        Behavior: Behavior normal.      GU / Detailed Urogynecologic Evaluation:  Pelvic Exam: Normal external female genitalia; Bartholin's and Skene's glands normal in appearance; urethral meatus normal in appearance, no urethral masses or discharge.   CST: negative  s/p hysterectomy: Speculum exam reveals normal vaginal mucosa with  atrophy and normal vaginal cuff.  Adnexa no mass, fullness, tenderness.    Pelvic floor strength II/V  Pelvic floor musculature: Right levator non-tender, Right obturator non-tender, Left levator non-tender, Left obturator non-tender  POP-Q:   POP-Q  -3                                            Aa   -3                                           Ba  -8                                              C   2                                            Gh  4                                            Pb  8  tvl   -3                                            Ap  -3                                            Bp                                                 D      Rectal Exam:  Normal external rectum  Post-Void Residual (PVR) by Bladder Scan: In order to evaluate bladder emptying, we discussed obtaining a postvoid residual and she agreed to this procedure.  Procedure: The ultrasound unit was placed on the patient's abdomen in the suprapubic region after the patient had voided. A PVR of 0 ml was obtained by bladder scan.  Laboratory Results: POC urine: moderate blood    ASSESSMENT AND  PLAN Ms. Ziolkowski is a 72 y.o. with:  1. SUI (stress urinary incontinence, female)   2. Microscopic hematuria    SUI -For treatment of stress urinary incontinence,  non-surgical options include expectant management, weight loss, physical therapy, as well as a pessary.  Surgical options include a midurethral sling, and transurethral injection of a bulking agent. - She would like to start with pelvic PT, referral placed.   2. Blood in urine - moderate blood on POC urine. Will send for micro UA and culture to confirm hematuria. Will contact patient if further workup is required.   Return as needed  Jaquita Folds, MD

## 2022-07-21 NOTE — Addendum Note (Signed)
Addended by: Elita Quick on: 07/21/2022 10:18 AM   Modules accepted: Orders

## 2022-07-22 LAB — URINALYSIS, MICROSCOPIC ONLY
Bacteria, UA: NONE SEEN
Casts: NONE SEEN /lpf
RBC, Urine: NONE SEEN /hpf (ref 0–2)

## 2022-07-23 LAB — URINE CULTURE

## 2022-08-24 ENCOUNTER — Encounter: Payer: Self-pay | Admitting: *Deleted

## 2022-08-31 NOTE — Therapy (Deleted)
OUTPATIENT PHYSICAL THERAPY FEMALE PELVIC EVALUATION   Patient Name: Samantha Brady MRN: [id number] DOB:Dec 12, 1950, 72 y.o., female Today's Date: 08/31/2022  END OF SESSION:   Past Medical History:  Diagnosis Date   Hypertension    Osteopenia    Premature ventricular contraction    Past Surgical History:  Procedure Laterality Date   ABDOMINAL HYSTERECTOMY     BREAST CYST EXCISION Left    unsure of year    BREAST EXCISIONAL BIOPSY Left    BREAST SURGERY     COLON RESECTION     pre cancerous tumor   Patient Active Problem List   Diagnosis Date Noted   Nuclear sclerotic cataract of both eyes 10/13/2021   Retinal horseshoe tear without detachment, left 10/14/2020   Retinal horseshoe tear without detachment, right 10/14/2020   Posterior vitreous detachment, right 10/14/2020   Retinal round hole without detachment, left 10/14/2020   History of colonic polyps 01/18/2020   Osteopenia after menopause 08/29/2019   Basal cell carcinoma (BCC) of canthus 08/29/2019   Encounter for screening mammogram for malignant neoplasm of breast 08/29/2019   Vitamin D deficiency 08/29/2019   Essential hypertension 08/29/2019    PCP: Michael Boston, MD  REFERRING PROVIDER: Jaquita Folds, MD   REFERRING DIAG: N39.3 (ICD-10-CM) - SUI (stress urinary incontinence, female)   THERAPY DIAG:  No diagnosis found.  Rationale for Evaluation and Treatment: Rehabilitation  ONSET DATE: ***  SUBJECTIVE:                                                                                                                                                                                           SUBJECTIVE STATEMENT: *** Fluid intake: {Yes/No:304960894}   PAIN:  Are you having pain? {yes/no:20286} NPRS scale: ***/10 Pain location: {pelvic pain location:27098}  Pain type: {type:313116} Pain description: {PAIN DESCRIPTION:21022940}   Aggravating factors: *** Relieving factors:  ***  PRECAUTIONS: None  WEIGHT BEARING RESTRICTIONS: {Yes ***/No:24003}  FALLS:  Has patient fallen in last 6 months? {fallsyesno:27318}  LIVING ENVIRONMENT: Lives with: {[facility] lives with:25569::"lives with their family"} Lives in: {Lives in:25570} Stairs: {opstairs:27293} Has following equipment at home: {Assistive devices:23999}  OCCUPATION: ***  PLOF: {PLOF:24004}  PATIENT GOALS: ***  PERTINENT HISTORY:  Abdominal Hysterectomy, colon resection Sexual abuse: {Yes/No:304960894}  BOWEL MOVEMENT: Pain with bowel movement: {yes/no:20286} Type of bowel movement:{PT BM type:27100} Fully empty rectum: {Yes/No:304960894} Leakage: {Yes/No:304960894} Pads: {Yes/No:304960894} Fiber supplement: {Yes/No:304960894}  URINATION: Pain with urination: {yes/no:20286} Fully empty bladder: Yes: will urinate multiple times in a row Stream: {PT urination:27102} Urgency: {Yes/No:304960894} Frequency: Day time voids 5.  Nocturia: 1 times per night to void.  Leakage: Coughing, Sneezing, and Laughing, leaks 3 times per day depending on how full the bladder is Pads: Yes: liners  INTERCOURSE: Pain with intercourse: {pain with intercourse PA:27099} Ability to have vaginal penetration:  {Yes/No:304960894} Climax: *** Marinoff Scale: ***/3  PREGNANCY: Vaginal deliveries *** Tearing {Yes***/No:304960894} C-section deliveries *** Currently pregnant {Yes***/No:304960894}  PROLAPSE: {PT prolapse:27101}   OBJECTIVE:   DIAGNOSTIC FINDINGS:  Pelvic floor strength II/V   PVR of 0 ml was obtained by bladder scan.   PATIENT SURVEYS:  {rehab surveys:24030}  PFIQ-7 ***  COGNITION: Overall cognitive status: {cognition:24006}     SENSATION: Light touch: {intact/deficits:24005} Proprioception: {intact/deficits:24005}  MUSCLE LENGTH: Hamstrings: Right *** deg; Left *** deg Thomas test: Right *** deg; Left *** deg  LUMBAR SPECIAL TESTS:  {lumbar special test:25242}  FUNCTIONAL  TESTS:  {Functional tests:24029}  GAIT: Distance walked: *** Assistive device utilized: {Assistive devices:23999} Level of assistance: {Levels of assistance:24026} Comments: ***  POSTURE: {posture:25561}  PELVIC ALIGNMENT:  LUMBARAROM/PROM:  A/PROM A/PROM  eval  Flexion   Extension   Right lateral flexion   Left lateral flexion   Right rotation   Left rotation    (Blank rows = not tested)  LOWER EXTREMITY ROM:  {AROM/PROM:27142} ROM Right eval Left eval  Hip flexion    Hip extension    Hip abduction    Hip adduction    Hip internal rotation    Hip external rotation    Knee flexion    Knee extension    Ankle dorsiflexion    Ankle plantarflexion    Ankle inversion    Ankle eversion     (Blank rows = not tested)  LOWER EXTREMITY MMT:  MMT Right eval Left eval  Hip flexion    Hip extension    Hip abduction    Hip adduction    Hip internal rotation    Hip external rotation    Knee flexion    Knee extension    Ankle dorsiflexion    Ankle plantarflexion    Ankle inversion    Ankle eversion     PALPATION:   General  ***                External Perineal Exam ***                             Internal Pelvic Floor ***  Patient confirms identification and approves PT to assess internal pelvic floor and treatment {yes/no:20286}  PELVIC MMT:   MMT eval  Vaginal   Internal Anal Sphincter   External Anal Sphincter   Puborectalis   Diastasis Recti   (Blank rows = not tested)        TONE: ***  PROLAPSE: ***  TODAY'S TREATMENT:  DATE: ***  EVAL ***   PATIENT EDUCATION:  Education details: *** Person educated: {Person educated:25204} Education method: {Education Method:25205} Education comprehension: {Education Comprehension:25206}  HOME EXERCISE PROGRAM: ***  ASSESSMENT:  CLINICAL IMPRESSION: Patient is a ***  y.o. *** who was seen today for physical therapy evaluation and treatment for ***.   OBJECTIVE IMPAIRMENTS: {opptimpairments:25111}.   ACTIVITY LIMITATIONS: {activitylimitations:27494}  PARTICIPATION LIMITATIONS: {participationrestrictions:25113}  PERSONAL FACTORS: {Personal factors:25162} are also affecting patient's functional outcome.   REHAB POTENTIAL: {rehabpotential:25112}  CLINICAL DECISION MAKING: {clinical decision making:25114}  EVALUATION COMPLEXITY: {Evaluation complexity:25115}   GOALS: Goals reviewed with patient? {yes/no:20286}  SHORT TERM GOALS: Target date: ***  *** Baseline: Goal status: {GOALSTATUS:25110}  2.  *** Baseline:  Goal status: {GOALSTATUS:25110}  3.  *** Baseline:  Goal status: {GOALSTATUS:25110}  4.  *** Baseline:  Goal status: {GOALSTATUS:25110}  5.  *** Baseline:  Goal status: {GOALSTATUS:25110}  6.  *** Baseline:  Goal status: {GOALSTATUS:25110}  LONG TERM GOALS: Target date: ***  *** Baseline:  Goal status: {GOALSTATUS:25110}  2.  *** Baseline:  Goal status: {GOALSTATUS:25110}  3.  *** Baseline:  Goal status: {GOALSTATUS:25110}  4.  *** Baseline:  Goal status: {GOALSTATUS:25110}  5.  *** Baseline:  Goal status: {GOALSTATUS:25110}  6.  *** Baseline:  Goal status: {GOALSTATUS:25110}  PLAN:  PT FREQUENCY: {rehab frequency:25116}  PT DURATION: {rehab duration:25117}  PLANNED INTERVENTIONS: {rehab planned interventions:25118::"Therapeutic exercises","Therapeutic activity","Neuromuscular re-education","Balance training","Gait training","Patient/Family education","Self Care","Joint mobilization"}  PLAN FOR NEXT SESSION: ***   Tylerjames Hoglund, PT 08/31/2022, 8:52 AM

## 2022-09-01 ENCOUNTER — Encounter: Payer: Medicare Other | Admitting: Physical Therapy

## 2022-09-16 NOTE — Therapy (Unsigned)
OUTPATIENT PHYSICAL THERAPY FEMALE PELVIC EVALUATION   Patient Name: Samantha Brady. Samantha Brady MRN: [id number] DOB:[birth year], 72 y.o., female Today's Date: 09/17/2022  END OF SESSION:  PT End of Session - 09/17/22 0913     Visit Number 1    Date for PT Re-Evaluation 12/10/22    Authorization Type Medicare    Authorization - Visit Number 1    Authorization - Number of Visits 10    PT Start Time 0830    PT Stop Time [id number]    PT Time Calculation (min) 44 min    Activity Tolerance Patient tolerated treatment well    Behavior During Therapy WFL for tasks assessed/performed             Past Medical History:  Diagnosis Date   Hypertension    Osteopenia    Premature ventricular contraction    Past Surgical History:  Procedure Laterality Date   ABDOMINAL HYSTERECTOMY     BREAST CYST EXCISION Left    unsure of year    BREAST EXCISIONAL BIOPSY Left    BREAST SURGERY     COLON RESECTION     pre cancerous tumor   Patient Active Problem List   Diagnosis Date Noted   Nuclear sclerotic cataract of both eyes 10/13/2021   Retinal horseshoe tear without detachment, left 10/14/2020   Retinal horseshoe tear without detachment, right 10/14/2020   Posterior vitreous detachment, right 10/14/2020   Retinal round hole without detachment, left 10/14/2020   History of colonic polyps 01/18/2020   Osteopenia after menopause 08/29/2019   Basal cell carcinoma (BCC) of canthus 08/29/2019   Encounter for screening mammogram for malignant neoplasm of breast 08/29/2019   Vitamin D deficiency 08/29/2019   Essential hypertension 08/29/2019    PCP: Michael Boston, MD  REFERRING PROVIDER: Jaquita Folds, MD   REFERRING DIAG: N39.3 (ICD-10-CM) - SUI (stress urinary incontinence, female)   THERAPY DIAG:  Muscle weakness (generalized)  Unspecified lack of coordination  Rationale for Evaluation and Treatment: Rehabilitation  ONSET DATE: 2009  SUBJECTIVE:                                                                                                                                                                                            SUBJECTIVE STATEMENT: Have had urinary leakage 20 years.  Fluid intake: Yes: water and tea in a bottle    PAIN:  Are you having pain? No  PRECAUTIONS: None  WEIGHT BEARING RESTRICTIONS: No  FALLS:  Has patient fallen in last 6 months? No  LIVING ENVIRONMENT: Lives with: lives alone  OCCUPATION: retired  PLOF: Independent  PATIENT GOALS:  not have to wear a pad  PERTINENT HISTORY:  Abdominal hysterectomy; colon resection; osteopenia  BOWEL MOVEMENT: No issues  URINATION: Pain with urination: No Fully empty bladder: When urinating, she feels the need to urinate multiple times in a row ; will urinate then wait 30 sec then urinate again.  Stream: Strong Urgency: No Frequency: Day time voids 5.  Nocturia: 1 times per night to void  Leakage: Coughing, Sneezing, Laughing, Exercise, and Lifting, 3 times per day, exercise has increased leakage with sit ups Pads: Yes: 1  INTERCOURSE: Not sexually active  PREGNANCY: Vaginal deliveries 2 Tearing Yes: some tearing with first son    OBJECTIVE:   DIAGNOSTIC FINDINGS:  Pelvic floor strength II/V  PVR of 0 ml was obtained by bladder scan   COGNITION: Overall cognitive status: Within functional limits for tasks assessed     SENSATION: Light touch: Appears intact Proprioception: Appears intact   POSTURE: No Significant postural limitations  PELVIC ALIGNMENT:  LUMBARAROM/PROM:  A/PROM A/PROM  eval  Flexion full  Extension Decreased by 50%  Right lateral flexion Decreased by 25%  Left lateral flexion Decreased by 25%  Right rotation full  Left rotation full   (Blank rows = not tested)  LOWER EXTREMITY ROM:  Passive ROM Right eval Left eval  Hip external rotation 50 50   (Blank rows = not tested)  LOWER EXTREMITY MMT:  MMT Right eval Left eval  Hip  extension 4/5 3+/5  Hip abduction 4/5 4/5   PALPATION:   General  Difficulty with contracting the lower abdominal; suprapubic scar is tight with decreased mobility; decreased mobility of L1-L5                External Perineal Exam dryness                             Internal Pelvic Floor tightness in the introitus and perineal body  Patient confirms identification and approves PT to assess internal pelvic floor and treatment Yes  PELVIC MMT:   MMT eval  Vaginal 2/5  (Blank rows = not tested)        TONE: increased  PROLAPSE: None  TODAY'S TREATMENT:                                                                                                                              DATE: 09/17/22  EVAL See below   PATIENT EDUCATION:  Education details: educated patient on vaginal moisturizers to improve elasticity of the tissue, manual work to the introitus and perineal body Person educated: Patient Education method: Consulting civil engineer, Media planner, Corporate treasurer cues, Verbal cues, and Handouts Education comprehension: verbalized understanding, returned demonstration, verbal cues required, tactile cues required, and needs further education  HOME EXERCISE PROGRAM: See above.   ASSESSMENT:  CLINICAL IMPRESSION: Patient is a 72 y.o. female who was seen today for physical therapy evaluation and treatment for stress incontinence. Patient  reports urinary leakage for the past 15 years.  Patient will leak urine with coughing, sneezing, laughing, sit ups, and lifting. She leaks 3 times per day and wears 1 pad per day. Pelvic floor strength is 2/5 and at times is able to do a little lift of the pelvic floor. She has dryness in the vulvar area and the tissue is tight along the introitus and perineal body. Patient has difficulty with relaxation of the pelvic floor. She has limited lumbar mobility for extension and bilateral sidebending. She has weakness in the hip abductors and extensors. Patient has  difficulty with contracting the lower abdomen. She has a scar suprapubically that is restricted. Patient will benefit from skilled therapy to improve pelvic floor coordination and strength to reduce her leakage.   OBJECTIVE IMPAIRMENTS: decreased activity tolerance, decreased coordination, decreased endurance, decreased strength, and increased fascial restrictions.   ACTIVITY LIMITATIONS: lifting, continence, and sit ups, laughing, coughing and sneezing  PARTICIPATION LIMITATIONS: community activity  PERSONAL FACTORS: Age, Fitness, and 1-2 comorbidities: Abdominal hysterectomy; colon resection; osteopenia  are also affecting patient's functional outcome.   REHAB POTENTIAL: Excellent  CLINICAL DECISION MAKING: Stable/uncomplicated  EVALUATION COMPLEXITY: Low   GOALS: Goals reviewed with patient? Yes  SHORT TERM GOALS: Target date: 10/13/22  Patient educated on vaginal moisturizers to improve vaginal health.  Baseline: Goal status: INITIAL  2.  Patient educated on perineal massage to improve elongation of the tissue.  Baseline:  Goal status: INITIAL  3.  Patient is able to contract the lower abdominals without bulging.  Baseline:  Goal status: INITIAL  4.  Patient educated on scar massage to reduce restrictions in the lower abdomen.  Baseline:  Goal status: INITIAL    LONG TERM GOALS: Target date: 12/10/22  Patient independent with advanced HEP for core and pelvic strength to reduce leakage.  Baseline:  Goal status: INITIAL  2.  Patient is able to perform abdominal exercises without leaking due to correct abdominal contraction and increased in pelvic floor strength.  Baseline:  Goal status: INITIAL  3.  Patient is able to cough, sneeze, laugh with minimal urinary leakage due to improved pelvic floor contraction.  Baseline:  Goal status: INITIAL  4.  Patient is able to perform pelvic drop due to the tissue is able to elongate and relax.  Baseline:  Goal status:  INITIAL  5.  Pelvic floor strength is >/= 3/5 with full elongation and relaxation to reduce leakage.  Baseline:  Goal status: INITIAL    PLAN:  PT FREQUENCY: 1x/week  PT DURATION: 12 weeks  PLANNED INTERVENTIONS: Therapeutic exercises, Therapeutic activity, Neuromuscular re-education, Patient/Family education, Joint mobilization, Dry Needling, Spinal mobilization, Cryotherapy, Moist heat, Biofeedback, and Manual therapy  PLAN FOR NEXT SESSION: manual work on abdomen and scar, lower abdominal contraction, diaphragmatic breathing, hip ER stretch, urogenital release   Earlie Counts, PT 09/17/22 9:26 AM

## 2022-09-17 ENCOUNTER — Encounter: Payer: Medicare Other | Attending: Obstetrics and Gynecology | Admitting: Physical Therapy

## 2022-09-17 ENCOUNTER — Other Ambulatory Visit: Payer: Self-pay

## 2022-09-17 ENCOUNTER — Encounter: Payer: Self-pay | Admitting: Physical Therapy

## 2022-09-17 DIAGNOSIS — N393 Stress incontinence (female) (male): Secondary | ICD-10-CM | POA: Diagnosis not present

## 2022-09-17 DIAGNOSIS — M6281 Muscle weakness (generalized): Secondary | ICD-10-CM | POA: Insufficient documentation

## 2022-09-17 DIAGNOSIS — R279 Unspecified lack of coordination: Secondary | ICD-10-CM | POA: Insufficient documentation

## 2022-09-17 NOTE — Patient Instructions (Signed)
Moisturizers They are used in the vagina to hydrate the mucous membrane that make up the vaginal canal. Designed to keep a more normal acid balance (ph) Once placed in the vagina, it will last between two to three days.  Use 2-3 times per week at bedtime  Ingredients to avoid is glycerin and fragrance, can increase chance of infection Should not be used just before sex due to causing irritation Most are gels administered either in a tampon-shaped applicator or as a vaginal suppository. They are non-hormonal.   Types of Moisturizers(internal use)  Vitamin E vaginal suppositories- Whole foods, Amazon Moist Again Coconut oil- can break down condoms Julva- (Do no use if on Tamoxifen) amazon Yes moisturizer- amazon NeuEve Silk , NeuEve Silver for menopausal or over 65 (if have severe vaginal atrophy or cancer treatments use NeuEve Silk for  1 month than move to [employer])- [employer], [place].com Olive and Bee intimate cream- [email or web address].com.au Mae vaginal moisturizer- Amazon Aloe Good Clean Love Hyaluronic acid Hyalofemme replens   Creams to use externally on the Vulva area [employer]'s (good for for cancer patients that had radiation to the area)- [place] or [email or web address].br V-magic cream - amazon Julva-amazon Vital "V [place] salve ( help moisturize and help with thinning vulvar area, does have Barclay by Irwin Brakeman labial moisturizer (Amazon,  Coconut or olive oil aloe Good Clean Love Enchanted Rose by intimate rose  Things to avoid in the vaginal area Do not use things to irritate the vulvar area No lotions just specialized creams for the vulva area- Neogyn, V-magic, No soaps; can use Aveeno or Calendula cleanser if needed. Must be gentle No deodorants No douches Good to sleep without underwear to let the vaginal area to air out No scrubbing: spread the lips to let warm water rinse  over labias and pat dry   Massage the perineal body and sides of the vaginal canal with 20 strokes 3 times per week.   Earlie Counts, PT [facility] [address], [place], [place] [address] W: [phone] [email or web address] [email or web address]

## 2022-09-28 ENCOUNTER — Encounter: Payer: Self-pay | Admitting: Cardiology

## 2022-09-28 ENCOUNTER — Ambulatory Visit: Payer: Medicare Other | Admitting: Cardiology

## 2022-09-28 VITALS — BP 135/83 | HR 76 | Ht 61.0 in | Wt 147.6 lb

## 2022-09-28 DIAGNOSIS — I10 Essential (primary) hypertension: Secondary | ICD-10-CM

## 2022-09-28 DIAGNOSIS — R55 Syncope and collapse: Secondary | ICD-10-CM

## 2022-09-28 DIAGNOSIS — I493 Ventricular premature depolarization: Secondary | ICD-10-CM

## 2022-09-28 MED ORDER — DILTIAZEM HCL ER COATED BEADS 120 MG PO CP24: 120.0000 mg | ORAL_CAPSULE | Freq: Every day | ORAL | 1 refills | Status: DC

## 2022-09-28 NOTE — Progress Notes (Signed)
ID:  Samantha Brady, Samantha Brady 1951-02-15, MRN [id number]  PCP:  Michael Boston, MD  Cardiologist:  Rex Kras, DO, [facility] (established care 02/05/2022)  Date: 09/28/22 Last Office Visit: 04/02/2022  Chief Complaint  Patient presents with   Premature ventricular contraction   Follow-up   Chest Pain    HPI  Samantha Brady is a 72 y.o. Caucasian female whose past medical history and cardiovascular risk factors include: Premature ventricular contractions, HTN, osteopenia, postmenopausal female, advanced age.  Patient was referred to the practice for evaluation of near syncope/syncope.  In June 2023 when she was traveling to [place] for upcoming cruise she was in the bathroom taking a shower under warm water without any prodromal symptoms had a syncopal event.  She had fell on the bathroom floor and her friends accompanied her and assisted her.  She regained consciousness within 2 seconds or so and did not sustain any significant injury and she continued on her cruise.  In July 2023 she had a near syncopal event and because of these 2 instances was referred to cardiology for further evaluation and management.  Thereafter patient underwent cardiac workup which included a Zio patch that illustrated a PVC burden of approximately 4.4%, 1 episode of NSVT (4 beats in duration for 2.3 seconds), and 1 asymptomatic nocturnal sinus pause of 3.2 seconds at 2 AM.  At last office visit she was started on diltiazem for PVC management and is here for 82-month follow-up visit.  Overall doing well from a cardiovascular standpoint.  She has had 2 episodes of precordial discomfort, anterior chest wall, not brought on by effort related activities, did not resolve with rest, lasted for few seconds, and self-limited.  FUNCTIONAL STATUS: Walks three days every other week.  ALLERGIES: No Known Allergies  MEDICATION LIST PRIOR TO VISIT: Current Meds  Medication Sig   BIOTIN 5000 PO Take by mouth.   Cholecalciferol  (VITAMIN D) 50 MCG (2000 UT) tablet Take 2,000 Units by mouth daily.   hydrochlorothiazide (MICROZIDE) 12.5 MG capsule Take 6.25 mg by mouth daily.   [DISCONTINUED] diltiazem (CARDIZEM CD) 120 MG 24 hr capsule TAKE 1 CAPSULE (120 MG TOTAL) BY MOUTH DAILY AT 10 PM.     PAST MEDICAL HISTORY: Past Medical History:  Diagnosis Date   Hypertension    Osteopenia    Premature ventricular contraction     PAST SURGICAL HISTORY: Past Surgical History:  Procedure Laterality Date   ABDOMINAL HYSTERECTOMY     BREAST CYST EXCISION Left    unsure of year    BREAST EXCISIONAL BIOPSY Left    BREAST SURGERY     COLON RESECTION     pre cancerous tumor    FAMILY HISTORY: The patient family history includes Cancer in her father; Dementia in her mother; Diabetes in her father; Hypertension in her father and mother; Seizures in her sister.  SOCIAL HISTORY:  The patient  reports that she has never smoked. She has never used smokeless tobacco. She reports that she does not currently use alcohol. She reports that she does not use drugs.  REVIEW OF SYSTEMS: Review of Systems  Cardiovascular:  Negative for chest pain, claudication, dyspnea on exertion, irregular heartbeat, leg swelling, near-syncope, orthopnea, palpitations, paroxysmal nocturnal dyspnea and syncope.  Respiratory:  Negative for shortness of breath.   Hematologic/Lymphatic: Negative for bleeding problem.  Musculoskeletal:  Negative for muscle cramps and myalgias.  Neurological:  Negative for dizziness and light-headedness.    PHYSICAL EXAM:    09/28/2022  12:57 PM 07/21/2022    8:44 AM 04/02/2022   10:02 AM  Vitals with BMI  Height 5\' 1"  5' 1.89" 5\' 2"   Weight 147 lbs 10 oz 144 lbs 10 oz 140 lbs 3 oz  BMI 27.9 [id number]  Systolic [id number] A999333 [id number]  Diastolic 83 88 75  Pulse 76 86 54    Physical Exam  Constitutional: She appears healthy.  Neck: No JVD present.  Cardiovascular: Normal rate, regular rhythm, S1 normal, S2 normal,  intact distal pulses and normal pulses. Exam reveals no gallop, no S3 and no S4.  No murmur heard. Pulmonary/Chest: Effort normal. No stridor. She has no wheezes. She has no rales.  Abdominal: Soft. Bowel sounds are normal. She exhibits no distension. There is no abdominal tenderness.  Musculoskeletal:        General: No edema.  Neurological: She is alert and oriented to person, place, and time. She has intact cranial nerves (2-12).  Skin: Skin is warm and moist.    CARDIAC DATABASE: EKG: 09/28/2022: Sinus rhythm, 65 bpm, consider old anteroseptal infarct, no significant change compared to prior ECG.  Echocardiogram: 03/19/2022:  Normal LV systolic function with visual EF 55-60%. Left ventricle cavity is normal in size. Normal left ventricular wall thickness. Normal global wall motion. Normal diastolic filling pattern, normal LAP. Calculated EF 55%.  Structurally normal tricuspid valve with trace regurgitation. No evidence of pulmonary hypertension.  No prior available for comparison.   Stress Testing: Exercise treadmill stress test 03/20/2022: Exercise treadmill stress test performed using Bruce protocol.  Patient reached 5.9 METS, and 90% of age predicted maximum heart rate.  Exercise capacity was low.  No chest pain reported.  Normal heart rate and hemodynamic response. Stress EKG revealed no ischemic changes.  Frequent multifocal PVC's noted during rest and stress. Recommend clinical correlation.   Heart Catheterization: None  Cardiac monitor (Zio Patch): Dominant rhythm sinus. Heart rate 42-162 bpm.  Avg HR 78 bpm. No atrial fibrillation, supraventricular tachycardia,  high grade AV block. 1 asymptomatic episode of NSVT, 4 beats, 2.3 seconds duration, max HR 162 bpm.  1 asymptomatic episode of sinus pause 3.2 seconds duration, on 02/20/2022 at 2 AM. Total ventricular ectopic burden 4.4%. Total supraventricular ectopic burden <1%. Patient triggered events: 1.  Underlying rhythm  normal sinus.  LABORATORY DATA: Collected 01/16/2022 provided by PCP Total cholesterol 202, triglycerides 63, HDL 64, LDL 124, non-HDL 137. TSH 1.67 Hemoglobin 14.1 g/dL, hematocrit 41.1% Sodium 142, potassium 3.7, chloride 104, bicarb 31, BUN 15, creatinine 0.7 AST 17, ALT 16, alkaline phosphatase 79  C   IMPRESSION:    ICD-10-CM   1. Premature ventricular contraction  I49.3 EKG 12-Lead    LONG TERM MONITOR (3-14 DAYS)    diltiazem (CARDIZEM CD) 120 MG 24 hr capsule    2. Syncope and collapse  R55     3. Essential hypertension  I10        RECOMMENDATIONS: Samantha Brady is a 72 y.o. Caucasian female whose past medical history and cardiac risk factors include: Premature ventricular contractions, HTN, osteopenia, postmenopausal female, advanced age.  Premature ventricular contraction Prior PVC burden approximately 4.4%. Started on diltiazem 120 mg p.o. daily 6 months ago, has done well. Medications refilled. Will repeat a cardiac monitor as per patient's request to reevaluate her PVC burden. Monitor for now  Syncope and collapse No reoccurrence of syncope or syncopal events since last office visit. Continue current medical therapy  Essential hypertension Office blood pressures are within acceptable limits.  Continue current medical therapy. Currently managed by primary care provider.  Patient was provided reassuring that the precordial discomfort she has had twice over the last 6 months likely is noncardiac based on symptoms.  She has undergone ischemic workup as outlined above.  If the symptoms were to increase in intensity frequency or duration or has typical pain she is welcome to come back sooner for reevaluation or go to the ER for further evaluation as well.   FINAL MEDICATION LIST END OF ENCOUNTER: Meds ordered this encounter  Medications   diltiazem (CARDIZEM CD) 120 MG 24 hr capsule    Sig: Take 1 capsule (120 mg total) by mouth daily at 10 pm.     Dispense:  90 capsule    Refill:  1    Medications Discontinued During This Encounter  Medication Reason   diltiazem (CARDIZEM CD) 120 MG 24 hr capsule Reorder      Current Outpatient Medications:    BIOTIN 5000 PO, Take by mouth., Disp: , Rfl:    Cholecalciferol (VITAMIN D) 50 MCG (2000 UT) tablet, Take 2,000 Units by mouth daily., Disp: , Rfl:    hydrochlorothiazide (MICROZIDE) 12.5 MG capsule, Take 6.25 mg by mouth daily., Disp: , Rfl:    diltiazem (CARDIZEM CD) 120 MG 24 hr capsule, Take 1 capsule (120 mg total) by mouth daily at 10 pm., Disp: 90 capsule, Rfl: 1  Orders Placed This Encounter  Procedures   LONG TERM MONITOR (3-14 DAYS)   EKG 12-Lead    There are no Patient Instructions on file for this visit.   --Continue cardiac medications as reconciled in final medication list. --Return in about 6 months (around 03/31/2023) for Follow up PVC. or sooner if needed. --Continue follow-up with your primary care physician regarding the management of your other chronic comorbid conditions.  Patient's questions and concerns were addressed to her satisfaction. She voices understanding of the instructions provided during this encounter.   This note was created using a voice recognition software as a result there may be grammatical errors inadvertently enclosed that do not reflect the nature of this encounter. Every attempt is made to correct such errors.  Rex Kras, [place], [facility]  Pager:  [phone] Office: [phone]

## 2022-09-29 ENCOUNTER — Encounter: Payer: Medicare Other | Admitting: Physical Therapy

## 2022-09-29 ENCOUNTER — Encounter: Payer: Self-pay | Admitting: Physical Therapy

## 2022-09-29 DIAGNOSIS — M6281 Muscle weakness (generalized): Secondary | ICD-10-CM

## 2022-09-29 DIAGNOSIS — R279 Unspecified lack of coordination: Secondary | ICD-10-CM

## 2022-09-29 NOTE — Therapy (Signed)
OUTPATIENT PHYSICAL THERAPY TREATMENT NOTE   Patient Name: Samantha Brady MRN: [id number] DOB:[birth year], 72 y.o., female Today's Date: 09/29/2022  PCP: Michael Boston, MD  REFERRING PROVIDER: Jaquita Folds, MD   END OF SESSION:   PT End of Session - 09/29/22 0835     Visit Number 2    Date for PT Re-Evaluation 12/10/22    Authorization Type Medicare    Authorization - Visit Number 2    Authorization - Number of Visits 10    PT Start Time [phone]    PT Stop Time 0920    PT Time Calculation (min) 45 min    Activity Tolerance Patient tolerated treatment well    Behavior During Therapy WFL for tasks assessed/performed             Past Medical History:  Diagnosis Date   Hypertension    Osteopenia    Premature ventricular contraction    Past Surgical History:  Procedure Laterality Date   ABDOMINAL HYSTERECTOMY     BREAST CYST EXCISION Left    unsure of year    BREAST EXCISIONAL BIOPSY Left    BREAST SURGERY     COLON RESECTION     pre cancerous tumor   Patient Active Problem List   Diagnosis Date Noted   Nuclear sclerotic cataract of both eyes 10/13/2021   Retinal horseshoe tear without detachment, left 10/14/2020   Retinal horseshoe tear without detachment, right 10/14/2020   Posterior vitreous detachment, right 10/14/2020   Retinal round hole without detachment, left 10/14/2020   History of colonic polyps 01/18/2020   Osteopenia after menopause 08/29/2019   Basal cell carcinoma (BCC) of [place] 08/29/2019   Encounter for screening mammogram for malignant neoplasm of breast 08/29/2019   Vitamin D deficiency 08/29/2019   Essential hypertension 08/29/2019   REFERRING DIAG: N39.3 (ICD-10-CM) - SUI (stress urinary incontinence, female)    THERAPY DIAG:  Muscle weakness (generalized)   Unspecified lack of coordination   Rationale for Evaluation and Treatment: Rehabilitation   ONSET DATE: 2009   SUBJECTIVE:                                                                                                                                                                                             SUBJECTIVE STATEMENT: I would like a review with the HEP.  Fluid intake: Yes: water and tea in a bottle     PAIN:  Are you having pain? No   PRECAUTIONS: None   WEIGHT BEARING RESTRICTIONS: No   FALLS:  Has patient fallen in last 6 months? No   LIVING ENVIRONMENT: Lives  with: lives alone   OCCUPATION: retired   PLOF: Independent   PATIENT GOALS: not have to wear a pad   PERTINENT HISTORY:  Abdominal hysterectomy; colon resection; osteopenia   BOWEL MOVEMENT: No issues   URINATION: Pain with urination: No Fully empty bladder: When urinating, she feels the need to urinate multiple times in a row ; will urinate then wait 30 sec then urinate again.  Stream: Strong Urgency: No Frequency: Day time voids 5.  Nocturia: 1 times per night to void  Leakage: Coughing, Sneezing, Laughing, Exercise, and Lifting, 3 times per day, exercise has increased leakage with sit ups Pads: Yes: 1   INTERCOURSE: Not sexually active   PREGNANCY: Vaginal deliveries 2 Tearing Yes: some tearing with first son       OBJECTIVE:    DIAGNOSTIC FINDINGS:  Pelvic floor strength II/V  PVR of 0 ml was obtained by bladder scan    COGNITION: Overall cognitive status: Within functional limits for tasks assessed                          SENSATION: Light touch: Appears intact Proprioception: Appears intact     POSTURE: No Significant postural limitations   PELVIC ALIGNMENT:   LUMBARAROM/PROM:   A/PROM A/PROM  eval  Flexion full  Extension Decreased by 50%  Right lateral flexion Decreased by 25%  Left lateral flexion Decreased by 25%  Right rotation full  Left rotation full   (Blank rows = not tested)   LOWER EXTREMITY ROM:   Passive ROM Right eval Left eval  Hip external rotation 50 50   (Blank rows = not tested)   LOWER EXTREMITY  MMT:   MMT Right eval Left eval  Hip extension 4/5 3+/5  Hip abduction 4/5 4/5    PALPATION:   General  Difficulty with contracting the lower abdominal; suprapubic scar is tight with decreased mobility; decreased mobility of L1-L5                 External Perineal Exam dryness                             Internal Pelvic Floor tightness in the introitus and perineal body   Patient confirms identification and approves PT to assess internal pelvic floor and treatment Yes   PELVIC MMT:   MMT eval 09/29/22  Vaginal 2/5 3/5  (Blank rows = not tested)         TONE: increased   PROLAPSE: None   TODAY'S TREATMENT:       09/29/22 Manual: Internal pelvic floor techniques: No emotional/communication barriers or cognitive limitation. Patient is motivated to learn. Patient understands and agrees with treatment goals and plan. PT explains patient will be examined in standing, sitting, and lying down to see how their muscles and joints work. When they are ready, they will be asked to remove their underwear so PT can examine their perineum. The patient is also given the option of providing their own chaperone as one is not provided in our facility. The patient also has the right and is explained the right to defer or refuse any part of the evaluation or treatment including the internal exam. With the patient's consent, PT will use one gloved finger to gently assess the muscles of the pelvic floor, seeing how well it contracts and relaxes and if there is muscle symmetry. After, the patient will get dressed  and PT and patient will discuss exam findings and plan of care. PT and patient discuss plan of care, schedule, attendance policy and HEP activities.  Educated patient on how to perform manual work to the vulvar and perineal area. She was able to return demonstration.  Educated patient on the anatomy of the pelvic floor Going through the vaginal canal working on the urogenital diaphragm, along the  perineal body and levator ani to elongate Dry needling: Neuromuscular re-education: Core facilitation: Diaphragmatic breathing with transverse abdominus contraction using tactile cues to the lower abdomen Pelvic floor contraction training: Pelvic floor contraction with tactile cues 10x holding for 8 seconds Quick pelvic floor contractions    PATIENT EDUCATION: 09/29/22 Education details: Access Code: [email or web address], educated on using moisturizer to the vaginal tissue. Educated on how to massage the introitus Person educated: Patient Education method: Explanation, Demonstration, Tactile cues, Verbal cues, and Handouts Education comprehension: verbalized understanding, returned demonstration, verbal cues required, tactile cues required, and needs further education     HOME EXERCISE PROGRAM: 09/29/22 Access Code: [email or web address] URL: [email or web address] Date: 09/29/2022 Prepared by: Earlie Counts  Exercises - Supine Transversus Abdominis Bracing - Hands on Ground  - 1 x daily - 7 x weekly - 1 sets - 10 reps - Supine Pelvic Floor Contraction  - 3 x daily - 7 x weekly - 1 sets - 10 reps - 8 sec hold - Quick Flick Pelvic Floor Contractions in Hooklying  - 3 x daily - 7 x weekly - 1 sets - 5 reps    ASSESSMENT:   CLINICAL IMPRESSION: Patient is a 72 y.o. female who was seen today for physical therapy  treatment for stress incontinence.  Patient was able to perform diaphragmatic breathing after practice. Patient is able to contract the pelvic floor with pull up and strength increased to 3/5. She is able to engage her lower abdominals and feel them contract. Patient will benefit from skilled therapy to improve pelvic floor coordination and strength to reduce her leakage.    OBJECTIVE IMPAIRMENTS: decreased activity tolerance, decreased coordination, decreased endurance, decreased strength, and increased fascial restrictions.    ACTIVITY LIMITATIONS: lifting, continence, and sit ups,  laughing, coughing and sneezing   PARTICIPATION LIMITATIONS: community activity   PERSONAL FACTORS: Age, Fitness, and 1-2 comorbidities: Abdominal hysterectomy; colon resection; osteopenia  are also affecting patient's functional outcome.    REHAB POTENTIAL: Excellent   CLINICAL DECISION MAKING: Stable/uncomplicated   EVALUATION COMPLEXITY: Low     GOALS: Goals reviewed with patient? Yes   SHORT TERM GOALS: Target date: 10/13/22   Patient educated on vaginal moisturizers to improve vaginal health.  Baseline: Goal status: Met 09/29/22   2.  Patient educated on perineal massage to improve elongation of the tissue.  Baseline:  Goal status: Met 09/29/22   3.  Patient is able to contract the lower abdominals without bulging.  Baseline:  Goal status: INITIAL   4.  Patient educated on scar massage to reduce restrictions in the lower abdomen.  Baseline:  Goal status: INITIAL       LONG TERM GOALS: Target date: 12/10/22   Patient independent with advanced HEP for core and pelvic strength to reduce leakage.  Baseline:  Goal status: INITIAL   2.  Patient is able to perform abdominal exercises without leaking due to correct abdominal contraction and increased in pelvic floor strength.  Baseline:  Goal status: INITIAL   3.  Patient is able to cough, sneeze, laugh with minimal urinary leakage due to improved pelvic  floor contraction.  Baseline:  Goal status: INITIAL   4.  Patient is able to perform pelvic drop due to the tissue is able to elongate and relax.  Baseline:  Goal status: INITIAL   5.  Pelvic floor strength is >/= 3/5 with full elongation and relaxation to reduce leakage.  Baseline:  Goal status: INITIAL       PLAN:   PT FREQUENCY: 1x/week   PT DURATION: 12 weeks   PLANNED INTERVENTIONS: Therapeutic exercises, Therapeutic activity, Neuromuscular re-education, Patient/Family education, Joint mobilization, Dry Needling, Spinal mobilization, Cryotherapy, Moist  heat, Biofeedback, and Manual therapy   PLAN FOR NEXT SESSION: manual work on abdomen and scar, lower abdominal contraction, hip ER stretch,   Earlie Counts, PT 09/29/22 9:34 AM

## 2022-10-01 ENCOUNTER — Ambulatory Visit: Payer: Medicare Other | Admitting: Cardiology

## 2022-10-06 ENCOUNTER — Ambulatory Visit: Payer: Medicare Other

## 2022-10-12 ENCOUNTER — Other Ambulatory Visit: Payer: Medicare Other

## 2022-10-13 ENCOUNTER — Encounter: Payer: Medicare Other | Attending: Obstetrics and Gynecology | Admitting: Physical Therapy

## 2022-10-13 ENCOUNTER — Encounter: Payer: Self-pay | Admitting: Physical Therapy

## 2022-10-13 DIAGNOSIS — M6281 Muscle weakness (generalized): Secondary | ICD-10-CM | POA: Insufficient documentation

## 2022-10-13 DIAGNOSIS — R279 Unspecified lack of coordination: Secondary | ICD-10-CM

## 2022-10-13 NOTE — Therapy (Signed)
OUTPATIENT PHYSICAL THERAPY TREATMENT NOTE   Patient Name: Samantha Brady MRN: [id number] DOB:Jul 03, 1951, 72 y.o., female Today's Date: 10/13/2022  PCP: Melida Quitter, MD  REFERRING PROVIDER: Marguerita Beards, MD   END OF SESSION:   PT End of Session - 10/13/22 1324     Visit Number 3    Authorization Type Medicare    Authorization - Visit Number 3    Authorization - Number of Visits 10    PT Start Time 0830    PT Stop Time 0915    PT Time Calculation (min) 45 min    Activity Tolerance Patient tolerated treatment well    Behavior During Therapy WFL for tasks assessed/performed             Past Medical History:  Diagnosis Date   Hypertension    Osteopenia    Premature ventricular contraction    Past Surgical History:  Procedure Laterality Date   ABDOMINAL HYSTERECTOMY     BREAST CYST EXCISION Left    unsure of year    BREAST EXCISIONAL BIOPSY Left    BREAST SURGERY     COLON RESECTION     pre cancerous tumor   Patient Active Problem List   Diagnosis Date Noted   Nuclear sclerotic cataract of both eyes 10/13/2021   Retinal horseshoe tear without detachment, left 10/14/2020   Retinal horseshoe tear without detachment, right 10/14/2020   Posterior vitreous detachment, right 10/14/2020   Retinal round hole without detachment, left 10/14/2020   History of colonic polyps 01/18/2020   Osteopenia after menopause 08/29/2019   Basal cell carcinoma (BCC) of [place]hus 08/29/2019   Encounter for screening mammogram for malignant neoplasm of breast 08/29/2019   Vitamin D deficiency 08/29/2019   Essential hypertension 08/29/2019   REFERRING DIAG: N39.3 (ICD-10-CM) - SUI (stress urinary incontinence, female)    THERAPY DIAG:  Muscle weakness (generalized)   Unspecified lack of coordination   Rationale for Evaluation and Treatment: Rehabilitation   ONSET DATE: 2009   SUBJECTIVE:                                                                                                                                                                                             SUBJECTIVE STATEMENT: I had a couple of times last week of leakage.  Fluid intake: Yes: water and tea in a bottle     PAIN:  Are you having pain? No   PRECAUTIONS: None   WEIGHT BEARING RESTRICTIONS: No   FALLS:  Has patient fallen in last 6 months? No   LIVING ENVIRONMENT: Lives with: lives alone   OCCUPATION:  retired   PLOF: Independent   PATIENT GOALS: not have to wear a pad   PERTINENT HISTORY:  Abdominal hysterectomy; colon resection; osteopenia   BOWEL MOVEMENT: No issues   URINATION: Pain with urination: No Fully empty bladder: When urinating, she feels the need to urinate multiple times in a row ; will urinate then wait 30 sec then urinate again.  Stream: Strong Urgency: No Frequency: Day time voids 5.  Nocturia: 1 times per night to void  Leakage: Coughing, Sneezing, Laughing, Exercise, and Lifting, 3 times per day, exercise has increased leakage with sit ups Pads: Yes: 1   INTERCOURSE: Not sexually active   PREGNANCY: Vaginal deliveries 2 Tearing Yes: some tearing with first son       OBJECTIVE:    DIAGNOSTIC FINDINGS:  Pelvic floor strength II/V  PVR of 0 ml was obtained by bladder scan    COGNITION: Overall cognitive status: Within functional limits for tasks assessed                          SENSATION: Light touch: Appears intact Proprioception: Appears intact     POSTURE: No Significant postural limitations   PELVIC ALIGNMENT:   LUMBARAROM/PROM:   A/PROM A/PROM  eval  Flexion full  Extension Decreased by 50%  Right lateral flexion Decreased by 25%  Left lateral flexion Decreased by 25%  Right rotation full  Left rotation full   (Blank rows = not tested)   LOWER EXTREMITY ROM:   Passive ROM Right eval Left eval  Hip external rotation 50 50   (Blank rows = not tested)   LOWER EXTREMITY MMT:   MMT Right eval Left eval   Hip extension 4/5 3+/5  Hip abduction 4/5 4/5    PALPATION:   General  Difficulty with contracting the lower abdominal; suprapubic scar is tight with decreased mobility; decreased mobility of L1-L5                 External Perineal Exam dryness                             Internal Pelvic Floor tightness in the introitus and perineal body   Patient confirms identification and approves PT to assess internal pelvic floor and treatment Yes   PELVIC MMT:   MMT eval 09/29/22  Vaginal 2/5 3/5  (Blank rows = not tested)         TONE: increased   PROLAPSE: None   TODAY'S TREATMENT:     10/13/22 Manual: Scar tissue mobilization: Scar mobilization on the scar and top and bottom to improve mobility Myofascial release: Release of the suprapubic area to improve bladder motility Exercises: Strengthening: Diaphragmatic breathing with transverse abdominus contraction using tactile cues to the lower abdomen Pelvic floor contraction with tactile cues 10x holding for 8 seconds Quick pelvic floor contractions  Standing Palloff with red banc and pelvic floor contraction 20x each way Standing pulling red band side to side to engage the abdominals 20x each way Dead lift with red band 15x with tactile cues to engage the pelvic floor and abdominals      09/29/22 Manual: Internal pelvic floor techniques: No emotional/communication barriers or cognitive limitation. Patient is motivated to learn. Patient understands and agrees with treatment goals and plan. PT explains patient will be examined in standing, sitting, and lying down to see how their muscles and joints work. When they are ready,  they will be asked to remove their underwear so PT can examine their perineum. The patient is also given the option of providing their own chaperone as one is not provided in our facility. The patient also has the right and is explained the right to defer or refuse any part of the evaluation or treatment including  the internal exam. With the patient's consent, PT will use one gloved finger to gently assess the muscles of the pelvic floor, seeing how well it contracts and relaxes and if there is muscle symmetry. After, the patient will get dressed and PT and patient will discuss exam findings and plan of care. PT and patient discuss plan of care, schedule, attendance policy and HEP activities.  Educated patient on how to perform manual work to the vulvar and perineal area. She was able to return demonstration.  Educated patient on the anatomy of the pelvic floor Going through the vaginal canal working on the urogenital diaphragm, along the perineal body and levator ani to elongate Neuromuscular re-education: Core facilitation: Diaphragmatic breathing with transverse abdominus contraction using tactile cues to the lower abdomen Pelvic floor contraction training: Pelvic floor contraction with tactile cues 10x holding for 8 seconds Quick pelvic floor contractions    PATIENT EDUCATION: 09/29/22 Education details: Access Code: [email or web address], educated on using moisturizer to the vaginal tissue. Educated on how to massage the introitus Person educated: Patient Education method: Explanation, Demonstration, Tactile cues, Verbal cues, and Handouts Education comprehension: verbalized understanding, returned demonstration, verbal cues required, tactile cues required, and needs further education     HOME EXERCISE PROGRAM: 09/29/22 Access Code: [email or web address] URL: [email or web address] Date: 09/29/2022 Prepared by: Eulis Foster   Exercises - Supine Transversus Abdominis Bracing - Hands on Ground  - 1 x daily - 7 x weekly - 1 sets - 10 reps - Supine Pelvic Floor Contraction  - 3 x daily - 7 x weekly - 1 sets - 10 reps - 8 sec hold - Quick Flick Pelvic Floor Contractions in Hooklying  - 3 x daily - 7 x weekly - 1 sets - 5 reps     ASSESSMENT:   CLINICAL IMPRESSION: Patient is a 71 y.o. female who was seen  today for physical therapy  treatment for stress incontinence.   Patient understands how to perform scar massage to improve tissue mobility. She is able to contract the lower abdominals better after the scar massage. She was able to exercise in therapy and not leak urine. Patient will benefit from skilled therapy to improve pelvic floor coordination and strength to reduce her leakage.    OBJECTIVE IMPAIRMENTS: decreased activity tolerance, decreased coordination, decreased endurance, decreased strength, and increased fascial restrictions.    ACTIVITY LIMITATIONS: lifting, continence, and sit ups, laughing, coughing and sneezing   PARTICIPATION LIMITATIONS: community activity   PERSONAL FACTORS: Age, Fitness, and 1-2 comorbidities: Abdominal hysterectomy; colon resection; osteopenia  are also affecting patient's functional outcome.    REHAB POTENTIAL: Excellent   CLINICAL DECISION MAKING: Stable/uncomplicated   EVALUATION COMPLEXITY: Low     GOALS: Goals reviewed with patient? Yes   SHORT TERM GOALS: Target date: 10/13/22   Patient educated on vaginal moisturizers to improve vaginal health.  Baseline: Goal status: Met 09/29/22   2.  Patient educated on perineal massage to improve elongation of the tissue.  Baseline:  Goal status: Met 09/29/22   3.  Patient is able to contract the lower abdominals without bulging.  Baseline:  Goal status: Met 10/13/22   4.  Patient educated  on scar massage to reduce restrictions in the lower abdomen.  Baseline:  Goal status: Met 10/13/22       LONG TERM GOALS: Target date: 12/10/22   Patient independent with advanced HEP for core and pelvic strength to reduce leakage.  Baseline:  Goal status: INITIAL   2.  Patient is able to perform abdominal exercises without leaking due to correct abdominal contraction and increased in pelvic floor strength.  Baseline:  Goal status: INITIAL   3.  Patient is able to cough, sneeze, laugh with minimal urinary  leakage due to improved pelvic floor contraction.  Baseline:  Goal status: INITIAL   4.  Patient is able to perform pelvic drop due to the tissue is able to elongate and relax.  Baseline:  Goal status: INITIAL   5.  Pelvic floor strength is >/= 3/5 with full elongation and relaxation to reduce leakage.  Baseline:  Goal status: INITIAL       PLAN:   PT FREQUENCY: 1x/week   PT DURATION: 12 weeks   PLANNED INTERVENTIONS: Therapeutic exercises, Therapeutic activity, Neuromuscular re-education, Patient/Family education, Joint mobilization, Dry Needling, Spinal mobilization, Cryotherapy, Moist heat, Biofeedback, and Manual therapy   PLAN FOR NEXT SESSION: update HEP , hip ER stretch,   Eulis Foster, PT 10/13/22 9:24 AM

## 2022-10-29 ENCOUNTER — Other Ambulatory Visit: Payer: Self-pay | Admitting: Internal Medicine

## 2022-10-29 DIAGNOSIS — Z1231 Encounter for screening mammogram for malignant neoplasm of breast: Secondary | ICD-10-CM

## 2022-11-04 NOTE — Progress Notes (Signed)
Called patient to inform her about her result. Patient understood.

## 2022-11-06 ENCOUNTER — Ambulatory Visit
Admission: RE | Admit: 2022-11-06 | Discharge: 2022-11-06 | Disposition: A | Discharge status: Home / Self Care | Payer: Medicare Other | Source: Ambulatory Visit | Attending: Internal Medicine | Admitting: Internal Medicine

## 2022-11-06 DIAGNOSIS — Z1231 Encounter for screening mammogram for malignant neoplasm of breast: Secondary | ICD-10-CM

## 2022-11-10 ENCOUNTER — Encounter: Payer: Medicare Other | Admitting: Physical Therapy

## 2022-11-24 ENCOUNTER — Encounter: Payer: Self-pay | Admitting: Physical Therapy

## 2022-11-24 ENCOUNTER — Encounter: Payer: Medicare Other | Attending: Obstetrics and Gynecology | Admitting: Physical Therapy

## 2022-11-24 DIAGNOSIS — R279 Unspecified lack of coordination: Secondary | ICD-10-CM | POA: Insufficient documentation

## 2022-11-24 DIAGNOSIS — M6281 Muscle weakness (generalized): Secondary | ICD-10-CM | POA: Diagnosis present

## 2022-11-24 NOTE — Therapy (Signed)
OUTPATIENT PHYSICAL THERAPY TREATMENT NOTE   Patient Name: Samantha Brady MRN: [id number] DOB:02/20/1951, 72 y.o., female Today's Date: 11/24/2022  PCP: Melida Quitter, MD  REFERRING PROVIDER: Marguerita Beards, MD   END OF SESSION:   PT End of Session - 11/24/22 1033     Visit Number 4    Date for PT Re-Evaluation 12/10/22    Authorization Type Medicare    Authorization - Visit Number 4    Authorization - Number of Visits 10    PT Start Time 1030    PT Stop Time 1115    PT Time Calculation (min) 45 min    Activity Tolerance Patient tolerated treatment well    Behavior During Therapy WFL for tasks assessed/performed             Past Medical History:  Diagnosis Date   Hypertension    Osteopenia    Premature ventricular contraction    Past Surgical History:  Procedure Laterality Date   ABDOMINAL HYSTERECTOMY     BREAST CYST EXCISION Left    unsure of year    BREAST EXCISIONAL BIOPSY Left    BREAST SURGERY     COLON RESECTION     pre cancerous tumor   Patient Active Problem List   Diagnosis Date Noted   Nuclear sclerotic cataract of both eyes 10/13/2021   Retinal horseshoe tear without detachment, left 10/14/2020   Retinal horseshoe tear without detachment, right 10/14/2020   Posterior vitreous detachment, right 10/14/2020   Retinal round hole without detachment, left 10/14/2020   History of colonic polyps 01/18/2020   Osteopenia after menopause 08/29/2019   Basal cell carcinoma (BCC) of [place] 08/29/2019   Encounter for screening mammogram for malignant neoplasm of breast 08/29/2019   Vitamin D deficiency 08/29/2019   Essential hypertension 08/29/2019   REFERRING DIAG: N39.3 (ICD-10-CM) - SUI (stress urinary incontinence, female)    THERAPY DIAG:  Muscle weakness (generalized)   Unspecified lack of coordination   Rationale for Evaluation and Treatment: Rehabilitation   ONSET DATE: 2009   SUBJECTIVE:                                                                                                                                                                                             SUBJECTIVE STATEMENT: The leakage has improved. Several days on vacation I did not wear pad. I pick up my grandchild leaked and leaked with sneeze.      PAIN:  Are you having pain? No   PRECAUTIONS: None   WEIGHT BEARING RESTRICTIONS: No   FALLS:  Has patient fallen in last 6 months?  No   LIVING ENVIRONMENT: Lives with: lives alone   OCCUPATION: retired   PLOF: Independent   PATIENT GOALS: not have to wear a pad   PERTINENT HISTORY:  Abdominal hysterectomy; colon resection; osteopenia   BOWEL MOVEMENT: No issues   URINATION: Pain with urination: No Fully empty bladder:  will urinate then wait 30 sec then urinate again.  Stream: Strong Urgency: No Frequency: Day time voids 5.  Nocturia: 1 times per night to void  Leakage: Coughing, Sneezing, Laughing, Exercise, and Lifting, 3 times per day, exercise has increased leakage with sit ups Pads: Yes: 1   INTERCOURSE: Not sexually active   PREGNANCY: Vaginal deliveries 2 Tearing Yes: some tearing with first son       OBJECTIVE:    DIAGNOSTIC FINDINGS:  Pelvic floor strength II/V  PVR of 0 ml was obtained by bladder scan    COGNITION: Overall cognitive status: Within functional limits for tasks assessed                          SENSATION: Light touch: Appears intact Proprioception: Appears intact     POSTURE: No Significant postural limitations   PELVIC ALIGNMENT: left ilium is rotated posteriorly   LUMBARAROM/PROM:   A/PROM A/PROM  eval  Flexion full  Extension Decreased by 50%  Right lateral flexion Decreased by 25%  Left lateral flexion Decreased by 25%  Right rotation full  Left rotation full   (Blank rows = not tested)   LOWER EXTREMITY ROM:   Passive ROM Right eval Left eval  Hip external rotation 50 50   (Blank rows = not tested)   LOWER  EXTREMITY MMT:   MMT Right eval Left eval  Hip extension 4/5 3+/5  Hip abduction 4/5 4/5    PALPATION:   General  Difficulty with contracting the lower abdominal; suprapubic scar is tight with decreased mobility; decreased mobility of L1-L5                 External Perineal Exam dryness                             Internal Pelvic Floor tightness in the introitus and perineal body   Patient confirms identification and approves PT to assess internal pelvic floor and treatment Yes   PELVIC MMT:   MMT eval 09/29/22  Vaginal 2/5 3/5  (Blank rows = not tested)         TONE: increased   PROLAPSE: None   TODAY'S TREATMENT:   11/24/22 Manual: Scar tissue mobilization: Scar work with lifting the scar up to mobilize it Spinal mobilization: PA and rotational mobilization of L3-L5 grade 3 PA mobilization of the left SI joint Exercises: Stretches/mobility: Piriformis stretch in supine holding 30 sec bil.  Strengthening: Prone hip extension 10 x each leg Bridge with ball squeeze and pelvic floor contraction 10 x Supine transverse abdominus 10 x with ball squeeze Supine bird dog with red band around knees and using the yellow band on the arms Standing pallof with pelvic floor and abdominal contraction 10 X each way    10/13/22 Manual: Scar tissue mobilization: Scar mobilization on the scar and top and bottom to improve mobility Myofascial release: Release of the suprapubic area to improve bladder motility Exercises: Strengthening: Diaphragmatic breathing with transverse abdominus contraction using tactile cues to the lower abdomen Pelvic floor contraction with tactile cues 10x holding for 8 seconds  Quick pelvic floor contractions  Standing Palloff with red banc and pelvic floor contraction 20x each way Standing pulling red band side to side to engage the abdominals 20x each way Dead lift with red band 15x with tactile cues to engage the pelvic floor and abdominals       09/29/22 Manual: Internal pelvic floor techniques: No emotional/communication barriers or cognitive limitation. Patient is motivated to learn. Patient understands and agrees with treatment goals and plan. PT explains patient will be examined in standing, sitting, and lying down to see how their muscles and joints work. When they are ready, they will be asked to remove their underwear so PT can examine their perineum. The patient is also given the option of providing their own chaperone as one is not provided in our facility. The patient also has the right and is explained the right to defer or refuse any part of the evaluation or treatment including the internal exam. With the patient's consent, PT will use one gloved finger to gently assess the muscles of the pelvic floor, seeing how well it contracts and relaxes and if there is muscle symmetry. After, the patient will get dressed and PT and patient will discuss exam findings and plan of care. PT and patient discuss plan of care, schedule, attendance policy and HEP activities.  Educated patient on how to perform manual work to the vulvar and perineal area. She was able to return demonstration.  Educated patient on the anatomy of the pelvic floor Going through the vaginal canal working on the urogenital diaphragm, along the perineal body and levator ani to elongate Neuromuscular re-education: Core facilitation: Diaphragmatic breathing with transverse abdominus contraction using tactile cues to the lower abdomen Pelvic floor contraction training: Pelvic floor contraction with tactile cues 10x holding for 8 seconds Quick pelvic floor contractions    PATIENT EDUCATION: 11/24/22 Education details: Access Code: [email or web address], educated on using moisturizer to the vaginal tissue. Educated on how to massage the introitus Person educated: Patient Education method: Explanation, Demonstration, Tactile cues, Verbal cues, and Handouts Education comprehension:  verbalized understanding, returned demonstration, verbal cues required, tactile cues required, and needs further education     HOME EXERCISE PROGRAM: 11/24/22 Access Code: [email or web address] URL: [email or web address] Date: 11/24/2022 Prepared by: Eulis Foster  Exercises - Supine Pelvic Floor Contraction  - 3 x daily - 7 x weekly - 1 sets - 10 reps - 8 sec hold - Quick Flick Pelvic Floor Contractions in Hooklying  - 3 x daily - 7 x weekly - 1 sets - 5 reps - Supine Figure 4 Piriformis Stretch with Leg Extension  - 1 x daily - 7 x weekly - 1 sets - 2 reps - 30 sec hold - Supine Bridge with Pelvic Floor Contraction and Hip Rotation  - 1 x daily - 7 x weekly - 1 sets - 10 reps - Dead Bug  - 1 x daily - 3 x weekly - 2 sets - 10 reps - Clamshell  - 1 x daily - 3 x weekly - 2 sets - 10 reps - Standing Anti-Rotation Press with Anchored Resistance  - 1 x daily - 3 x weekly - 2 sets - 10 reps     ASSESSMENT:   CLINICAL IMPRESSION: Patient is a 72 y.o. female who was seen today for physical therapy  treatment for stress incontinence.  Patient pelvis in correct alignment after manual work. She has advanced her HEP to incorporate more core and pelvic floor. She still has some restrictions in  her lower abdominal scar and difficulty to pull it up off the muscles. She reports her urinary leakage has improved.  Patient will benefit from skilled therapy to improve pelvic floor coordination and strength to reduce her leakage.    OBJECTIVE IMPAIRMENTS: decreased activity tolerance, decreased coordination, decreased endurance, decreased strength, and increased fascial restrictions.    ACTIVITY LIMITATIONS: lifting, continence, and sit ups, laughing, coughing and sneezing   PARTICIPATION LIMITATIONS: community activity   PERSONAL FACTORS: Age, Fitness, and 1-2 comorbidities: Abdominal hysterectomy; colon resection; osteopenia  are also affecting patient's functional outcome.    REHAB POTENTIAL:  Excellent   CLINICAL DECISION MAKING: Stable/uncomplicated   EVALUATION COMPLEXITY: Low     GOALS: Goals reviewed with patient? Yes   SHORT TERM GOALS: Target date: 10/13/22   Patient educated on vaginal moisturizers to improve vaginal health.  Baseline: Goal status: Met 09/29/22   2.  Patient educated on perineal massage to improve elongation of the tissue.  Baseline:  Goal status: Met 09/29/22   3.  Patient is able to contract the lower abdominals without bulging.  Baseline:  Goal status: Met 10/13/22   4.  Patient educated on scar massage to reduce restrictions in the lower abdomen.  Baseline:  Goal status: Met 10/13/22       LONG TERM GOALS: Target date: 12/10/22   Patient independent with advanced HEP for core and pelvic strength to reduce leakage.  Baseline:  Goal status: INITIAL   2.  Patient is able to perform abdominal exercises without leaking due to correct abdominal contraction and increased in pelvic floor strength.  Baseline:  Goal status: INITIAL   3.  Patient is able to cough, sneeze, laugh with minimal urinary leakage due to improved pelvic floor contraction.  Baseline:  Goal status: INITIAL   4.  Patient is able to perform pelvic drop due to the tissue is able to elongate and relax.  Baseline:  Goal status: INITIAL   5.  Pelvic floor strength is >/= 3/5 with full elongation and relaxation to reduce leakage.  Baseline:  Goal status: INITIAL       PLAN:   PT FREQUENCY: 1x/week   PT DURATION: 12 weeks   PLANNED INTERVENTIONS: Therapeutic exercises, Therapeutic activity, Neuromuscular re-education, Patient/Family education, Joint mobilization, Dry Needling, Spinal mobilization, Cryotherapy, Moist heat, Biofeedback, and Manual therapy   PLAN FOR NEXT SESSION: standing dead lift, pelvic floor quick contractions, dry needling suprapubic scar   Eulis Foster, PT 11/24/22 11:31 AM

## 2022-12-08 ENCOUNTER — Encounter: Payer: Medicare Other | Attending: Obstetrics and Gynecology | Admitting: Physical Therapy

## 2022-12-08 ENCOUNTER — Encounter: Payer: Self-pay | Admitting: Physical Therapy

## 2022-12-08 DIAGNOSIS — R279 Unspecified lack of coordination: Secondary | ICD-10-CM | POA: Diagnosis present

## 2022-12-08 DIAGNOSIS — M6281 Muscle weakness (generalized): Secondary | ICD-10-CM | POA: Diagnosis present

## 2022-12-08 NOTE — Therapy (Signed)
OUTPATIENT PHYSICAL THERAPY TREATMENT NOTE   Patient Name: Samantha Brady. Samantha Brady MRN: [id number] DOB:03-Mar-1951, 71 y.o., female Today's Date: 12/08/2022  PCP: Melida Quitter, MD  REFERRING PROVIDER: Marguerita Beards, MD   END OF SESSION:   PT End of Session - 12/08/22 1040     Visit Number 5    Date for PT Re-Evaluation 03/01/23    Authorization Type Medicare    Authorization - Visit Number 5    Authorization - Number of Visits 10    PT Start Time 1030    PT Stop Time 1115    PT Time Calculation (min) 45 min    Activity Tolerance Patient tolerated treatment well    Behavior During Therapy WFL for tasks assessed/performed             Past Medical History:  Diagnosis Date   Hypertension    Osteopenia    Premature ventricular contraction    Past Surgical History:  Procedure Laterality Date   ABDOMINAL HYSTERECTOMY     BREAST CYST EXCISION Left    unsure of year    BREAST EXCISIONAL BIOPSY Left    BREAST SURGERY     COLON RESECTION     pre cancerous tumor   Patient Active Problem List   Diagnosis Date Noted   Nuclear sclerotic cataract of both eyes 10/13/2021   Retinal horseshoe tear without detachment, left 10/14/2020   Retinal horseshoe tear without detachment, right 10/14/2020   Posterior vitreous detachment, right 10/14/2020   Retinal round hole without detachment, left 10/14/2020   History of colonic polyps 01/18/2020   Osteopenia after menopause 08/29/2019   Basal cell carcinoma (BCC) of [place] 08/29/2019   Encounter for screening mammogram for malignant neoplasm of breast 08/29/2019   Vitamin D deficiency 08/29/2019   Essential hypertension 08/29/2019   REFERRING DIAG: N39.3 (ICD-10-CM) - SUI (stress urinary incontinence, female)    THERAPY DIAG:  Muscle weakness (generalized)   Unspecified lack of coordination   Rationale for Evaluation and Treatment: Rehabilitation   ONSET DATE: 2009   SUBJECTIVE:                                                                                                                                                                                             SUBJECTIVE STATEMENT: The leakage has improved. Several days on vacation I did not wear pad. I pick up my grandchild leaked and leaked with sneeze.      PAIN:  Are you having pain? No   PRECAUTIONS: None   WEIGHT BEARING RESTRICTIONS: No   FALLS:  Has patient fallen in last 6 months?  No   LIVING ENVIRONMENT: Lives with: lives alone   OCCUPATION: retired   PLOF: Independent   PATIENT GOALS: not have to wear a pad   PERTINENT HISTORY:  Abdominal hysterectomy; colon resection; osteopenia   BOWEL MOVEMENT: No issues   URINATION: Pain with urination: No Fully empty bladder:  will urinate then wait 30 sec then urinate again.  Stream: Strong Urgency: No Frequency: Day time voids 5.  Nocturia: 1 times per night to void  Leakage: Coughing, Sneezing, Laughing, Exercise, and Lifting, 3 times per day, exercise has increased leakage with sit ups Pads: Yes: 1   INTERCOURSE: Not sexually active   PREGNANCY: Vaginal deliveries 2 Tearing Yes: some tearing with first son       OBJECTIVE:    DIAGNOSTIC FINDINGS:  Pelvic floor strength II/V  PVR of 0 ml was obtained by bladder scan    COGNITION: Overall cognitive status: Within functional limits for tasks assessed                          SENSATION: Light touch: Appears intact Proprioception: Appears intact     POSTURE: No Significant postural limitations   PELVIC ALIGNMENT: left ilium is rotated posteriorly   12/08/22 Pelvis in correct alignment LUMBARAROM/PROM:   A/PROM A/PROM  eval 12/08/22  Flexion full full  Extension Decreased by 50% full  Right lateral flexion Decreased by 25% full  Left lateral flexion Decreased by 25% full  Right rotation full full  Left rotation full full   (Blank rows = not tested)   LOWER EXTREMITY ROM:   Passive ROM Right eval Left eval  Right  12/08/22 Left  12/08/22  Hip external rotation 50 50 60 65   (Blank rows = not tested)   LOWER EXTREMITY MMT:   MMT Right eval Left eval Right 12/08/22 Left  12/08/22  Hip extension 4/5 3+/5 4/5 4/5  Hip abduction 4/5 4/5 4/5 5/5    PALPATION:   General  Difficulty with contracting the lower abdominal; suprapubic scar is tight with decreased mobility; decreased mobility of L1-L5                 External Perineal Exam dryness                             Internal Pelvic Floor tightness in the introitus and perineal body   Patient confirms identification and approves PT to assess internal pelvic floor and treatment Yes   PELVIC MMT:   MMT eval 09/29/22  Vaginal 2/5 3/5  (Blank rows = not tested)         TONE: increased   PROLAPSE: None   TODAY'S TREATMENT:   12/08/22 Manual: Taping Kinsieo tape to the scar to lift and mobilize Scar tissue mobilization: Manual work to scar after the dry needling to elongate the scar Myofascial release: Spinal mobilization: Internal pelvic floor techniques: Trigger Point Dry-Needling  Treatment instructions: Expect mild to moderate muscle soreness. S/S of pneumothorax if dry needled over a lung field, and to seek immediate medical attention should they occur. Patient verbalized understanding of these instructions and education.  Patient Consent Given: Yes Education handout provided: Yes Muscles treated: along the suprapubic scar Electrical stimulation performed: No Parameters: N/A Treatment response/outcome: elongation of scar  Neuromuscular re-education: Core retraining: Core facilitation: Dead bug with tactile cues to do alternate extremity 20 x Bridge with marching 20 x Clamshell 10  x each side Form correction: Pelvic floor contraction training: Sitting pelvic floor contraction holding 5 sec 10 x Sitting quick contraction 5 x     11/24/22 Manual: Scar tissue mobilization: Scar work with lifting the scar up to mobilize  it Spinal mobilization: PA and rotational mobilization of L3-L5 grade 3 PA mobilization of the left SI joint Exercises: Stretches/mobility: Piriformis stretch in supine holding 30 sec bil.  Strengthening: Prone hip extension 10 x each leg Bridge with ball squeeze and pelvic floor contraction 10 x Supine transverse abdominus 10 x with ball squeeze Supine bird dog with red band around knees and using the yellow band on the arms Standing pallof with pelvic floor and abdominal contraction 10 X each way    10/13/22 Manual: Scar tissue mobilization: Scar mobilization on the scar and top and bottom to improve mobility Myofascial release: Release of the suprapubic area to improve bladder motility Exercises: Strengthening: Diaphragmatic breathing with transverse abdominus contraction using tactile cues to the lower abdomen Pelvic floor contraction with tactile cues 10x holding for 8 seconds Quick pelvic floor contractions  Standing Palloff with red banc and pelvic floor contraction 20x each way Standing pulling red band side to side to engage the abdominals 20x each way Dead lift with red band 15x with tactile cues to engage the pelvic floor and abdominals        PATIENT EDUCATION: 11/24/22 Education details: Access Code: [email or web address], educated on using moisturizer to the vaginal tissue. Educated on how to massage the introitus Person educated: Patient Education method: Explanation, Demonstration, Tactile cues, Verbal cues, and Handouts Education comprehension: verbalized understanding, returned demonstration, verbal cues required, tactile cues required, and needs further education     HOME EXERCISE PROGRAM: 11/24/22 Access Code: [email or web address] URL: [email or web address] Date: 11/24/2022 Prepared by: Eulis Foster  Exercises - Supine Pelvic Floor Contraction  - 3 x daily - 7 x weekly - 1 sets - 10 reps - 8 sec hold - Quick Flick Pelvic Floor Contractions in [place]  - 3 x daily  - 7 x weekly - 1 sets - 5 reps - Supine Figure 4 Piriformis Stretch with Leg Extension  - 1 x daily - 7 x weekly - 1 sets - 2 reps - 30 sec hold - Supine Bridge with Pelvic Floor Contraction and Hip Rotation  - 1 x daily - 7 x weekly - 1 sets - 10 reps - Dead Bug  - 1 x daily - 3 x weekly - 2 sets - 10 reps - Clamshell  - 1 x daily - 3 x weekly - 2 sets - 10 reps - Standing Anti-Rotation Press with Anchored Resistance  - 1 x daily - 3 x weekly - 2 sets - 10 reps     ASSESSMENT:   CLINICAL IMPRESSION: Patient is a 72 y.o. female who was seen today for physical therapy  treatment for stress incontinence.  Urinary leakage with coughing, sneezing, and laughing is improved by 20%.  Lumbar ROM is full. Improved hip external rotation. Improved hip extension strength. Pelvic floor strength is 3/5. She has improved lower abdominal contraction. She is independent with her current HEP. Patient will benefit from skilled therapy to improve pelvic floor coordination and strength to reduce her leakage.    OBJECTIVE IMPAIRMENTS: decreased activity tolerance, decreased coordination, decreased endurance, decreased strength, and increased fascial restrictions.    ACTIVITY LIMITATIONS: lifting, continence, and sit ups, laughing, coughing and sneezing   PARTICIPATION LIMITATIONS: community activity   PERSONAL FACTORS: Age, Fitness, and 1-2  comorbidities: Abdominal hysterectomy; colon resection; osteopenia  are also affecting patient's functional outcome.    REHAB POTENTIAL: Excellent   CLINICAL DECISION MAKING: Stable/uncomplicated   EVALUATION COMPLEXITY: Low     GOALS: Goals reviewed with patient? Yes   SHORT TERM GOALS: Target date: 10/13/22   Patient educated on vaginal moisturizers to improve vaginal health.  Baseline: Goal status: Met 09/29/22   2.  Patient educated on perineal massage to improve elongation of the tissue.  Baseline:  Goal status: Met 09/29/22   3.  Patient is able to contract  the lower abdominals without bulging.  Baseline:  Goal status: Met 10/13/22   4.  Patient educated on scar massage to reduce restrictions in the lower abdomen.  Baseline:  Goal status: Met 10/13/22       LONG TERM GOALS: Target date: 12/10/22   Patient independent with advanced HEP for core and pelvic strength to reduce leakage.  Baseline:  Goal status: ongoing 12/08/22   2.  Patient is able to perform abdominal exercises without leaking due to correct abdominal contraction and increased in pelvic floor strength.  Baseline:  Goal status: ongoing 12/08/22   3.  Patient is able to cough, sneeze, laugh with minimal urinary leakage due to improved pelvic floor contraction.  Baseline: improved by 20% Goal status: ongoing 12/08/22   4.  Patient is able to perform pelvic drop due to the tissue is able to elongate and relax.  Baseline:  Goal status: ongoing 12/08/22   5.  Pelvic floor strength is >/= 3/5 with full elongation and relaxation to reduce leakage.  Baseline:  Goal status: ongoing 12/08/22       PLAN:   PT FREQUENCY: 1x/week   PT DURATION: 12 weeks   PLANNED INTERVENTIONS: Therapeutic exercises, Therapeutic activity, Neuromuscular re-education, Patient/Family education, Joint mobilization, Dry Needling, Spinal mobilization, Cryotherapy, Moist heat, Biofeedback, and Manual therapy   PLAN FOR NEXT SESSION: standing dead lift, pelvic floor quick contractions, dry needling suprapubic scar and see how it went next time  Eulis Foster, PT 12/08/22 11:32 AM

## 2023-01-21 ENCOUNTER — Encounter: Payer: Medicare Other | Attending: Obstetrics and Gynecology | Admitting: Physical Therapy

## 2023-01-21 ENCOUNTER — Encounter: Payer: Self-pay | Admitting: Physical Therapy

## 2023-01-21 DIAGNOSIS — R279 Unspecified lack of coordination: Secondary | ICD-10-CM | POA: Diagnosis present

## 2023-01-21 DIAGNOSIS — M6281 Muscle weakness (generalized): Secondary | ICD-10-CM | POA: Insufficient documentation

## 2023-01-21 NOTE — Therapy (Signed)
OUTPATIENT PHYSICAL THERAPY TREATMENT NOTE   Patient Name: Samantha Brady. [name] MRN: [id number] DOB:18-Dec-1950, 72 y.o., female Today's Date: 01/21/2023  PCP: Samantha Quitter, MD  REFERRING PROVIDER: Marguerita Beards, MD   END OF SESSION:   PT End of Session - 01/21/23 1605     Visit Number 6    Date for PT Re-Evaluation 03/01/23    Authorization Type Medicare    Authorization - Visit Number 6    Authorization - Number of Visits 10    PT Start Time 1600    PT Stop Time 1645    PT Time Calculation (min) 45 min    Activity Tolerance Patient tolerated treatment well    Behavior During Therapy WFL for tasks assessed/performed             Past Medical History:  Diagnosis Date   Hypertension    Osteopenia    Premature ventricular contraction    Past Surgical History:  Procedure Laterality Date   ABDOMINAL HYSTERECTOMY     BREAST CYST EXCISION Left    unsure of year    BREAST EXCISIONAL BIOPSY Left    BREAST SURGERY     COLON RESECTION     pre cancerous tumor   Patient Active Problem List   Diagnosis Date Noted   Nuclear sclerotic cataract of both eyes 10/13/2021   Retinal horseshoe tear without detachment, left 10/14/2020   Retinal horseshoe tear without detachment, right 10/14/2020   Posterior vitreous detachment, right 10/14/2020   Retinal round hole without detachment, left 10/14/2020   History of colonic polyps 01/18/2020   Osteopenia after menopause 08/29/2019   Basal cell carcinoma (BCC) of [place] 08/29/2019   Encounter for screening mammogram for malignant neoplasm of breast 08/29/2019   Vitamin D deficiency 08/29/2019   Essential hypertension 08/29/2019   REFERRING DIAG: N39.3 (ICD-10-CM) - SUI (stress urinary incontinence, female)    THERAPY DIAG:  Muscle weakness (generalized)   Unspecified lack of coordination   Rationale for Evaluation and Treatment: Rehabilitation   ONSET DATE: 2009   SUBJECTIVE:                                                                                                                                                                                             SUBJECTIVE STATEMENT: Last week I stopped wearing pads.    PAIN:  Are you having pain? No   PRECAUTIONS: None   WEIGHT BEARING RESTRICTIONS: No   FALLS:  Has patient fallen in last 6 months? No   LIVING ENVIRONMENT: Lives with: lives alone   OCCUPATION: retired   PLOF: Independent  PATIENT GOALS: not have to wear a pad   PERTINENT HISTORY:  Abdominal hysterectomy; colon resection; osteopenia   BOWEL MOVEMENT: No issues   URINATION: Pain with urination: No Fully empty bladder:  will urinate then wait 30 sec then urinate again.  Stream: Strong Urgency: No Frequency: Day time voids 5.  Nocturia: 1 times per night to void  Leakage:none  INTERCOURSE: Not sexually active   PREGNANCY: Vaginal deliveries 2 Tearing Yes: some tearing with first son       OBJECTIVE:    DIAGNOSTIC FINDINGS:  Pelvic floor strength II/V  PVR of 0 ml was obtained by bladder scan    COGNITION: Overall cognitive status: Within functional limits for tasks assessed                          SENSATION: Light touch: Appears intact Proprioception: Appears intact     POSTURE: No Significant postural limitations   PELVIC ALIGNMENT: left ilium is rotated posteriorly   12/08/22 Pelvis in correct alignment LUMBARAROM/PROM:   A/PROM A/PROM  eval 12/08/22  Flexion full full  Extension Decreased by 50% full  Right lateral flexion Decreased by 25% full  Left lateral flexion Decreased by 25% full  Right rotation full full  Left rotation full full   (Blank rows = not tested)   LOWER EXTREMITY ROM:   Passive ROM Right eval Left eval Right  12/08/22 Left  12/08/22  Hip external rotation 50 50 60 65   (Blank rows = not tested)   LOWER EXTREMITY MMT:   MMT Right eval Left eval Right 12/08/22 Left  12/08/22  Hip extension 4/5 3+/5 4/5 4/5  Hip  abduction 4/5 4/5 4/5 5/5    PALPATION:   General  Difficulty with contracting the lower abdominal; suprapubic scar is tight with decreased mobility; decreased mobility of L1-L5                 External Perineal Exam dryness                             Internal Pelvic Floor tightness in the introitus and perineal body   Patient confirms identification and approves PT to assess internal pelvic floor and treatment Yes   PELVIC MMT:   MMT eval 09/29/22  Vaginal 2/5 3/5  (Blank rows = not tested)         TONE: increased   PROLAPSE: None   TODAY'S TREATMENT:   01/21/23 Manual: Scar tissue mobilization: Lifting the scar up to release the scar tissue going in different directions and showing patient how to perform herself.   Exercises: Strengthening: Bridge with march 20 x Supine march pressing on knee 20 x  Sit on in v and lean back and forth 10 x Sit on V and lean to outside knee then the other to work obliques 10 x Pallof 10 x  12/08/22 Manual: Taping Kinsieo tape to the scar to lift and mobilize Scar tissue mobilization: Manual work to scar after the dry needling to elongate the scar Myofascial release: Spinal mobilization: Internal pelvic floor techniques: Trigger Point Dry-Needling  Treatment instructions: Expect mild to moderate muscle soreness. S/S of pneumothorax if dry needled over a lung field, and to seek immediate medical attention should they occur. Patient verbalized understanding of these instructions and education.  Patient Consent Given: Yes Education handout provided: Yes Muscles treated: along the suprapubic scar Electrical stimulation performed:  No Parameters: N/A Treatment response/outcome: elongation of scar  Neuromuscular re-education: Core retraining: Core facilitation: Dead bug with tactile cues to do alternate extremity 20 x Bridge with marching 20 x Clamshell 10 x each side Form correction: Pelvic floor contraction training: Sitting  pelvic floor contraction holding 5 sec 10 x Sitting quick contraction 5 x     11/24/22 Manual: Scar tissue mobilization: Scar work with lifting the scar up to mobilize it Spinal mobilization: PA and rotational mobilization of L3-L5 grade 3 PA mobilization of the left SI joint Exercises: Stretches/mobility: Piriformis stretch in supine holding 30 sec bil.  Strengthening: Prone hip extension 10 x each leg Bridge with ball squeeze and pelvic floor contraction 10 x Supine transverse abdominus 10 x with ball squeeze Supine bird dog with red band around knees and using the yellow band on the arms Standing pallof with pelvic floor and abdominal contraction 10 X each way      PATIENT EDUCATION: 01/21/23 Education details: Access Code: [email or web address], educated on using moisturizer to the vaginal tissue. Educated on how to massage the introitus Person educated: Patient Education method: Explanation, Demonstration, Tactile cues, Verbal cues, and Handouts Education comprehension: verbalized understanding, returned demonstration, verbal cues required, tactile cues required, and needs further education     HOME EXERCISE PROGRAM: 01/21/23 Access Code: [email or web address] URL: [email or web address] Date: 01/21/2023 Prepared by: Eulis Foster  Exercises - Supine Figure 4 Piriformis Stretch with Leg Extension  - 1 x daily - 7 x weekly - 1 sets - 2 reps - 30 sec hold - Seated Pelvic Floor Contraction  - 3 x daily - 7 x weekly - 1 sets - 10 reps - 5 sec hold - Seated Quick Flick Pelvic Floor Contractions  - 3 x daily - 7 x weekly - 3 sets - 5 reps - Supine 90/90 Alternating Toe Touch  - 1 x daily - 2 x weekly - 1 sets - 10 reps - Abdominal Isometrics on Balance Disk  - 1 x daily - 2 x weekly - 1 sets - 10 reps - Marching Bridge  - 1 x daily - 2 x weekly - 1 sets - 10 reps - Dead Bug  - 1 x daily - 2 x weekly - 1 sets - 10 reps - Standing Anti-Rotation Press with Anchored Resistance  - 1 x daily - 2  x weekly - 1 sets - 10 reps      ASSESSMENT:   CLINICAL IMPRESSION: Patient is a 72 y.o. female who was seen today for physical therapy  treatment for stress incontinence.  No urinary leakage with coughing, sneezing, and laughing. .  Lumbar ROM is full. Improved hip external rotation. Improved hip extension strength. Pelvic floor strength is 3/5. She has improved lower abdominal contraction. She is independent with her current HEP.    OBJECTIVE IMPAIRMENTS: decreased activity tolerance, decreased coordination, decreased endurance, decreased strength, and increased fascial restrictions.    ACTIVITY LIMITATIONS: lifting, continence, and sit ups, laughing, coughing and sneezing   PARTICIPATION LIMITATIONS: community activity   PERSONAL FACTORS: Age, Fitness, and 1-2 comorbidities: Abdominal hysterectomy; colon resection; osteopenia  are also affecting patient's functional outcome.    REHAB POTENTIAL: Excellent   CLINICAL DECISION MAKING: Stable/uncomplicated   EVALUATION COMPLEXITY: Low     GOALS: Goals reviewed with patient? Yes   SHORT TERM GOALS: Target date: 10/13/22   Patient educated on vaginal moisturizers to improve vaginal health.  Baseline: Goal status: Met 09/29/22   2.  Patient educated on perineal massage  to improve elongation of the tissue.  Baseline:  Goal status: Met 09/29/22   3.  Patient is able to contract the lower abdominals without bulging.  Baseline:  Goal status: Met 10/13/22   4.  Patient educated on scar massage to reduce restrictions in the lower abdomen.  Baseline:  Goal status: Met 10/13/22       LONG TERM GOALS: Target date: 12/10/22   Patient independent with advanced HEP for core and pelvic strength to reduce leakage.  Baseline:  Goal status: ongoing 12/08/22   2.  Patient is able to perform abdominal exercises without leaking due to correct abdominal contraction and increased in pelvic floor strength.  Baseline:  Goal status: met 01/21/23   3.   Patient is able to cough, sneeze, laugh with minimal urinary leakage due to improved pelvic floor contraction.  Baseline:  Goal status: Met 01/21/23   4.  Patient is able to perform pelvic drop due to the tissue is able to elongate and relax.  Baseline:  Goal status: Met 01/21/23   5.  Pelvic floor strength is >/= 3/5 with full elongation and relaxation to reduce leakage.  Baseline:  Goal status: Met 01/21/23       PLAN: Discharge to HEP    Eulis Foster, PT 01/21/23 4:45 PM  PHYSICAL THERAPY DISCHARGE SUMMARY  Visits from Start of Care: 6  Current functional level related to goals / functional outcomes: See above.    Remaining deficits: See above.    Education / Equipment: HEP   Patient agrees to discharge. Patient goals were met. Patient is being discharged due to meeting the stated rehab goals. Thank you for the referral. Eulis Foster, PT 01/21/23 4:45 PM

## 2023-02-02 ENCOUNTER — Other Ambulatory Visit: Payer: Self-pay | Admitting: Internal Medicine

## 2023-02-02 DIAGNOSIS — N644 Mastodynia: Secondary | ICD-10-CM

## 2023-02-12 ENCOUNTER — Ambulatory Visit: Admission: RE | Admit: 2023-02-12 | Payer: Medicare Other | Source: Ambulatory Visit

## 2023-02-12 ENCOUNTER — Ambulatory Visit
Admission: RE | Admit: 2023-02-12 | Discharge: 2023-02-12 | Disposition: A | Discharge status: Home / Self Care | Payer: Medicare Other | Source: Ambulatory Visit | Attending: Internal Medicine | Admitting: Internal Medicine

## 2023-02-12 DIAGNOSIS — N644 Mastodynia: Secondary | ICD-10-CM

## 2023-02-15 ENCOUNTER — Telehealth: Payer: Self-pay | Admitting: Gastroenterology

## 2023-02-15 NOTE — Telephone Encounter (Signed)
I reviewed her chart.  She had a normal colonoscopy in 2018 without polyps.  Not sure why she needs a colonoscopy now unless she has a strong family history?  If no clear indication for colonoscopy now, may be best to see her in the office first to determine if she needs a procedure or not.  Thanks

## 2023-02-15 NOTE — Telephone Encounter (Signed)
Hi Dr. Adela Lank,    Supervising Provider PM 02/15/2023   We received a referral for patient to have a colonoscopy. Patient last a colonoscopy in 2018 in [place]. Patient now lives in [place] and has been recommended by her primary care provider. Patient states she is currently not having any symptoms or taking blood thinners. Patient did want to include that she had a colon resection in 2006. Her previous records are in [facility] for you to review and advise on scheduling.    Thank you.

## 2023-02-17 ENCOUNTER — Encounter: Payer: Self-pay | Admitting: Physician Assistant

## 2023-02-22 ENCOUNTER — Encounter (INDEPENDENT_AMBULATORY_CARE_PROVIDER_SITE_OTHER): Payer: Medicare Other | Admitting: Ophthalmology

## 2023-03-29 ENCOUNTER — Ambulatory Visit: Payer: Medicare Other | Admitting: Cardiology

## 2023-03-31 ENCOUNTER — Other Ambulatory Visit: Payer: Self-pay | Admitting: Cardiology

## 2023-03-31 DIAGNOSIS — I493 Ventricular premature depolarization: Secondary | ICD-10-CM

## 2023-04-13 ENCOUNTER — Ambulatory Visit: Payer: Medicare Other | Attending: Cardiology | Admitting: Cardiology

## 2023-04-13 ENCOUNTER — Encounter: Payer: Self-pay | Admitting: Cardiology

## 2023-04-13 VITALS — BP 138/62 | HR 60 | Resp 16 | Ht 61.0 in | Wt 143.0 lb

## 2023-04-13 DIAGNOSIS — I10 Essential (primary) hypertension: Secondary | ICD-10-CM | POA: Diagnosis present

## 2023-04-13 DIAGNOSIS — E782 Mixed hyperlipidemia: Secondary | ICD-10-CM

## 2023-04-13 DIAGNOSIS — I493 Ventricular premature depolarization: Secondary | ICD-10-CM

## 2023-04-13 NOTE — Progress Notes (Signed)
Cardiology Office Note:  .   Date:  04/13/2023  ID:  Samantha Brady, Samantha Brady 1951-05-08, MRN [id number] PCP:  Melida Quitter, MD  Former Cardiology Providers: None [facility] Providers Cardiologist:  Tessa Lerner, DO , [facility] (established care 02/05/2022) Electrophysiologist:  None  Click to update primary MD,subspecialty MD or APP then REFRESH:1}    History of Present Illness: .   Samantha Brady. Samantha Brady is a 72 y.o. Caucasian female whose past medical history and cardiovascular risk factors includes: Premature ventricular contractions, HTN, osteopenia, postmenopausal female, advanced age.   Brief History:  Patient was referred to the practice for evaluation of near syncope/syncope. In June 2023 when she was traveling to [place] for upcoming cruise she was in the bathroom taking a shower under warm water without any prodromal symptoms had a syncopal event. She had fell on the bathroom floor and her friends accompanied her and assisted her. She regained consciousness within 2 seconds or so and did not sustain any significant injury and she continued on her cruise. In July 2023 she had a near syncopal event and because of these 2 instances was referred to cardiology for further evaluation and management.   Cardiac workup included a Zio patch which noted a PVC burden of approximately 4.4%.  She was placed on diltiazem and her symptoms had improved significantly.  She had a repeat ZIO patch which noted a significant improvement in PVC burden.  She is doing well on the current dose of diltiazem.  In the interim she followed up with PCP and was noted to have hyperlipidemia and her ASCVD risk or was greater than 7.5%.  She was recommended to be on statin therapy.  However shared decision was to proceed with coronary calcium score which was 0.  And therefore she is currently not on lipid-lowering agents.  Clinically she denies any anginal chest pain or heart failure symptoms.   Review of Systems: .    Review of Systems  Cardiovascular:  Negative for chest pain, claudication, dyspnea on exertion, irregular heartbeat, leg swelling, near-syncope, orthopnea, palpitations, paroxysmal nocturnal dyspnea and syncope.  Respiratory:  Negative for shortness of breath.   Hematologic/Lymphatic: Negative for bleeding problem.  Musculoskeletal:  Negative for muscle cramps and myalgias.  Neurological:  Negative for dizziness and light-headedness.    Studies Reviewed:   EKG: EKG Interpretation Date/Time:  Tuesday April 13 2023 10:08:58 EDT Ventricular Rate:  60 PR Interval:  178 QRS Duration:  88 QT Interval:  414 QTC Calculation: 414 R Axis:   29  Text Interpretation: Normal sinus rhythm Septal infarct , age undetermined No previous ECGs available Confirmed by Tessa Lerner [phone]) on 04/13/2023 10:29:17 AM  Echocardiogram: 03/19/2022:  Normal LV systolic function with visual EF 55-60%. Left ventricle cavity is normal in size. Normal left ventricular wall thickness. Normal global wall motion. Normal diastolic filling pattern, normal LAP. Calculated EF 55%.  Structurally normal tricuspid valve with trace regurgitation. No evidence of pulmonary hypertension.  No prior available for comparison.    Stress Testing: Exercise treadmill stress test 03/20/2022: Exercise treadmill stress test performed using Bruce protocol.  Patient reached 5.9 METS, and 90% of age predicted maximum heart rate.  Exercise capacity was low.  No chest pain reported.  Normal heart rate and hemodynamic response. Stress EKG revealed no ischemic changes.  Frequent multifocal PVC's noted during rest and stress. Recommend clinical correlation.    Heart Catheterization: None   Cardiac monitor (Zio Patch): Dominant rhythm sinus. Heart rate 42-162 bpm.  Avg HR 78 bpm. No atrial fibrillation, supraventricular tachycardia,  high grade AV block. 1 asymptomatic episode of NSVT, 4 beats, 2.3 seconds duration, max HR 162 bpm.  1  asymptomatic episode of sinus pause 3.2 seconds duration, on 02/20/2022 at 2 AM. Total ventricular ectopic burden 4.4%. Total supraventricular ectopic burden <1%. Patient triggered events: 1.  Underlying rhythm normal sinus.  Cardiac monitor (Zio Patch): October 06, 2022 -October 13, 2022 Dominant rhythm sinus. Heart rate 52-142 bpm.  Avg HR 78 bpm. No atrial fibrillation, supraventricular tachycardia, ventricular tachycardia, high grade AV block, pauses (3 seconds or longer). Total ventricular ectopic burden <1%. Total supraventricular ectopic burden approximately 1.1% (predominantly isolated beats). Patient triggered events: 0.  Coronary artery calcification 02/15/2023 Total Agatston score: 0   RADIOLOGY: NA  Risk Assessment/Calculations:   NA   Labs:        No data to display             Latest Ref Rng & Units 02/09/2020    3:09 PM 01/18/2020   12:30 PM 08/29/2019   12:27 PM  BMP  Glucose 65 - 99 mg/dL 94  84  92   BUN 8 - 27 mg/dL 11  15  10    Creatinine 0.57 - 1.00 mg/dL 8.29  5.62  1.30   BUN/Creat Ratio 12 - 28 13  18  12    Sodium 134 - 144 mmol/L 142  141  142   Potassium 3.5 - 5.2 mmol/L 4.2  3.4  3.8   Chloride 96 - 106 mmol/L 107  100  102   CO2 20 - 29 mmol/L 26  27  25    Calcium 8.7 - 10.3 mg/dL 9.3  9.4  9.4       Latest Ref Rng & Units 02/09/2020    3:09 PM 01/18/2020   12:30 PM 08/29/2019   12:27 PM  CMP  Glucose 65 - 99 mg/dL 94  84  92   BUN 8 - 27 mg/dL 11  15  10    Creatinine 0.57 - 1.00 mg/dL 8.65  7.84  6.96   Sodium 134 - 144 mmol/L 142  141  142   Potassium 3.5 - 5.2 mmol/L 4.2  3.4  3.8   Chloride 96 - 106 mmol/L 107  100  102   CO2 20 - 29 mmol/L 26  27  25    Calcium 8.7 - 10.3 mg/dL 9.3  9.4  9.4   Total Protein 6.0 - 8.5 g/dL  7.1  7.5   Total Bilirubin 0.0 - 1.2 mg/dL  0.4  0.3   Alkaline Phos 48 - 121 IU/L  84  94   AST 0 - 40 IU/L  18  20   ALT 0 - 32 IU/L  12  10     Lab Results  Component Value Date   CHOL 210 (H) 01/18/2020    HDL 55 01/18/2020   LDLCALC 136 (H) 01/18/2020   TRIG 106 01/18/2020   CHOLHDL 3.8 01/18/2020   No results for input(s): "LIPOA" in the last 8760 hours. No components found for: "NTPROBNP" No results for input(s): "PROBNP" in the last 8760 hours. No results for input(s): "TSH" in the last 8760 hours.  Collected 01/16/2022 provided by PCP Total cholesterol 202, triglycerides 63, HDL 64, LDL 124, non-HDL 137. TSH 1.67 Hemoglobin 14.1 g/dL, hematocrit 29.5% Sodium 142, potassium 3.7, chloride 104, bicarb 31, BUN 15, creatinine 0.7 AST 17, ALT 16, alkaline phosphatase 79  Physical Exam:  Today's Vitals   04/13/23 1005  BP: 138/62  Pulse: 60  Resp: 16  SpO2: 94%  Weight: 143 lb (64.9 kg)  Height: 5\' 1"  (1.549 m)   Body mass index is 27.02 kg/m. Wt Readings from Last 3 Encounters:  04/13/23 143 lb (64.9 kg)  09/28/22 147 lb 9.6 oz (67 kg)  07/21/22 144 lb 9.6 oz (65.6 kg)    Physical Exam  Constitutional: She appears healthy.  Neck: No JVD present.  Cardiovascular: Normal rate, regular rhythm, S1 normal, S2 normal, intact distal pulses and normal pulses. Exam reveals no gallop, no S3 and no S4.  No murmur heard. Pulmonary/Chest: Effort normal. No stridor. She has no wheezes. She has no rales.  Abdominal: Soft. Bowel sounds are normal. She exhibits no distension. There is no abdominal tenderness.  Musculoskeletal:        General: No edema.  Neurological: She is alert and oriented to person, place, and time. She has intact cranial nerves (2-12).  Skin: Skin is warm and moist.     Impression & Recommendation(s):  Impression:   ICD-10-CM   1. Premature ventricular contraction  I49.3 EKG 12-Lead    2. Essential hypertension  I10     3. Mixed hyperlipidemia  E78.2        Recommendation(s):  Premature ventricular contraction Clinically doing well. Prior PVC burden 4.4%, most recent Zio patch illustrated a PVC burden of <1%. Continue diltiazem 120 mg p.o.  daily. Monitor for now.  Essential hypertension Office blood pressures are well-controlled. Continue current medical therapy  Mixed hyperlipidemia Diagnosed with hyperlipidemia by PCP and was recommended to be on statin therapy given her lipids as well as her 10-year risk of ASCVD.However, she underwent coronary artery calcium score which was 0 and a shared decision to hold off on lipid lowering agents for now. Managed by PCP.   Orders Placed:  Orders Placed This Encounter  Procedures   EKG 12-Lead    As part of medical decision making results of the EKG, Zio patch results were reviewed independently at today's visit.   Final Medication List:   No orders of the defined types were placed in this encounter.   There are no discontinued medications.   Current Outpatient Medications:    BIOTIN 5000 PO, Take by mouth., Disp: , Rfl:    Cholecalciferol (VITAMIN D) 50 MCG (2000 UT) tablet, Take 2,000 Units by mouth daily., Disp: , Rfl:    diltiazem (CARDIZEM CD) 120 MG 24 hr capsule, TAKE 1 CAPSULE (120 MG TOTAL) BY MOUTH DAILY AT 10 PM., Disp: 90 capsule, Rfl: 1   hydrochlorothiazide (MICROZIDE) 12.5 MG capsule, Take 6.25 mg by mouth daily., Disp: , Rfl:   Consent:      N/A  Disposition:   Return in about 1 year (around 04/12/2024) for Follow up PVCs. or sooner if needed.  Her questions and concerns were addressed to her satisfaction. She voices understanding of the recommendations provided during this encounter.    Signed, Tessa Lerner, DO, [facility]  [address] [place], [place] [address] [phone] 04/13/2023 12:08 PM

## 2023-04-13 NOTE — Patient Instructions (Signed)
Medication Instructions:  Your physician recommends that you continue on your current medications as directed. Please refer to the Current Medication list given to you today.  *If you need a refill on your cardiac medications before your next appointment, please call your pharmacy*  Lab Work: None ordered today.  Testing/Procedures: None ordered today.  Follow-Up: At [facility], you and your health needs are our priority.  As part of our continuing mission to provide you with exceptional heart care, we have created designated Provider Care Teams.  These Care Teams include your primary Cardiologist (physician) and Advanced Practice Providers (APPs -  Physician Assistants and Nurse Practitioners) who all work together to provide you with the care you need, when you need it.  Your next appointment:   12 month(s)  The format for your next appointment:   In Person  Provider:   Tessa Lerner, DO {

## 2023-05-06 NOTE — Progress Notes (Signed)
05/10/2023 Samantha Brady. Samantha Brady [id number] May 05, 1951  Referring provider: Melida Quitter, MD Primary GI doctor: Dr. Adela Lank  ASSESSMENT AND PLAN:   History of Large Precancerous Colonic Polyp Partial colectomy in 2006 due to a large precancerous polyp. No polyps found in subsequent colonoscopies, including the most recent one in 2018. No current changes in bowel habits or signs of GI bleeding. -Schedule colonoscopy due to history of large precancerous polyp and partial colectomy at [facility] with Dr. Adela Lank We have discussed the risks of bleeding, infection, perforation, medication reactions, and remote risk of death associated with colonoscopy. All questions were answered and the patient acknowledges these risk and wishes to proceed.  Nausea with prep in the past Sent in zofran for nausea, take 20 mins to an hour before prep, call if any issues.   Lactose Intolerance Reports diarrhea after consuming milk products. -Advised to avoid lactose-containing foods.  Premature Ventricular Contractions (PVCs) PVCs noted during physical examination. No associated symptoms reported. -No intervention needed at this time.   Patient Care Team: Melida Quitter, MD as PCP - General (Internal Medicine) Tessa Lerner, DO as PCP - Cardiology (Cardiology)  HISTORY OF PRESENT ILLNESS: 72 y.o. female with a past medical history of hypertension, vitamin D deficiency, PVC's, history of colon polyps, status post partial colectomy 2006, diverticulosis and others listed below presents as a new patient in the office for colonoscopy evaluation.  2018 colonoscopy [place] with tics, healthy anastamosis.  Maternal aunt with colon cancer  Discussed the use of AI scribe software for clinical note transcription with the patient, who gave verbal consent to proceed.  The patient, with a history of a partial colectomy in 2006 due to a large precancerous polyp, presents for a routine follow-up. She reports no  new polyps since the initial one and her last colonoscopy was in December 2018, which showed anastomosis and diverticula but no polyps.  The patient's bowel movements have been regular and she denies any recent changes in bowel habits. She occasionally experiences diarrhea, which she attributes to lactose intolerance. She denies any dark black stool or blood in the stool. The patient has a family history of colon cancer, with her mother's aunt having had the disease. She denies any upper GI symptoms like heartburn or reflux. She does not take any fiber supplements and admits to not drinking a lot of water.  The patient has a history of PVCs but denies any heart history or being on a blood thinner. She reports no pain in the abdominal area.    She  reports that she has never smoked. She has never used smokeless tobacco. She reports current alcohol use. She reports that she does not use drugs.  RELEVANT LABS AND IMAGING:  Results   DIAGNOSTIC Colonoscopy: Anastomosis, diverticula, no polyps (06/2017)  PATHOLOGY Partial colectomy: Precancerous tumor (2006)      CBC No results found for: "WBC", "RBC", "HGB", "HCT", "PLT", "MCV", "MCH", "MCHC", "RDW", "LYMPHSABS", "MONOABS", "EOSABS", "BASOSABS" No results for input(s): "HGB" in the last 8760 hours.  CMP     Component Value Date/Time   NA 142 02/09/2020 1509   K 4.2 02/09/2020 1509   CL 107 (H) 02/09/2020 1509   CO2 26 02/09/2020 1509   GLUCOSE 94 02/09/2020 1509   BUN 11 02/09/2020 1509   CREATININE 0.88 02/09/2020 1509   CALCIUM 9.3 02/09/2020 1509   PROT 7.1 01/18/2020 1230   ALBUMIN 4.3 01/18/2020 1230   AST 18 01/18/2020 1230   ALT  12 01/18/2020 1230   ALKPHOS 84 01/18/2020 1230   BILITOT 0.4 01/18/2020 1230   GFRNONAA 68 02/09/2020 1509   GFRAA 78 02/09/2020 1509      Latest Ref Rng & Units 01/18/2020   12:30 PM 08/29/2019   12:27 PM  Hepatic Function  Total Protein 6.0 - 8.5 g/dL 7.1  7.5   Albumin 3.8 - 4.8 g/dL 4.3   4.0   AST 0 - 40 IU/L 18  20   ALT 0 - 32 IU/L 12  10   Alk Phosphatase 48 - 121 IU/L 84  94   Total Bilirubin 0.0 - 1.2 mg/dL 0.4  0.3       Current Medications:    Current Outpatient Medications (Cardiovascular):    diltiazem (CARDIZEM CD) 120 MG 24 hr capsule, TAKE 1 CAPSULE (120 MG TOTAL) BY MOUTH DAILY AT 10 PM.   hydrochlorothiazide (HYDRODIURIL) 25 MG tablet, Take 12.5 mg by mouth daily.     Current Outpatient Medications (Other):    BIOTIN 5000 PO, Take by mouth.   Cholecalciferol (VITAMIN D) 50 MCG (2000 UT) tablet, Take 2,000 Units by mouth daily.   Na Sulfate-K Sulfate-Mg Sulf 17.5-3.13-1.6 GM/177ML SOLN, Use as directed; may use generic; goodrx card if insurance will not cover generic   ondansetron (ZOFRAN) 4 MG tablet, Take 1 tablet (4 mg total) by mouth every 8 (eight) hours as needed for nausea or vomiting (take about 30 mins to 1 hour prior to colon prep).  Medical History:  Past Medical History:  Diagnosis Date   Hypertension    Osteopenia    Premature ventricular contraction    Allergies: No Known Allergies   Surgical History:  She  has a past surgical history that includes Colon resection; Abdominal hysterectomy; Breast cyst excision (Left); and Breast excisional biopsy (Left). Family History:  Her family history includes Colon cancer in her maternal aunt; Colon polyps in her father; Dementia in her mother; Diabetes in her father; Hypertension in her father, mother, and son; Prostate cancer in her father; Seizures in her sister.  REVIEW OF SYSTEMS  : All other systems reviewed and negative except where noted in the History of Present Illness.  PHYSICAL EXAM: BP 130/68 (BP Location: Left Arm, Patient Position: Sitting, Cuff Size: Normal)   Pulse 84   Ht 5\' 2"  (1.575 m) Comment: height measured without shoes  Wt 143 lb 8 oz (65.1 kg)   BMI 26.25 kg/m  General Appearance: Well nourished, in no apparent distress. Head:   Normocephalic and  atraumatic. Eyes:  sclerae anicteric,conjunctive pink  Respiratory: Respiratory effort normal, BS equal bilaterally without rales, rhonchi, wheezing. Cardio: RRR with no MRGs. Peripheral pulses intact.  Abdomen: Soft,  Non-distended ,active bowel sounds. No tenderness . Without guarding and Without rebound. No masses. Rectal: Not evaluated Musculoskeletal: Full ROM, Normal gait. Without edema. Skin:  Dry and intact without significant lesions or rashes Neuro: Alert and  oriented x4;  No focal deficits. Psych:  Cooperative. Normal mood and affect.    Doree Albee, PA-C 10:26 AM

## 2023-05-10 ENCOUNTER — Ambulatory Visit (INDEPENDENT_AMBULATORY_CARE_PROVIDER_SITE_OTHER): Payer: Medicare Other | Admitting: Physician Assistant

## 2023-05-10 ENCOUNTER — Encounter: Payer: Self-pay | Admitting: Physician Assistant

## 2023-05-10 VITALS — BP 130/68 | HR 84 | Ht 62.0 in | Wt 143.5 lb

## 2023-05-10 DIAGNOSIS — Z9049 Acquired absence of other specified parts of digestive tract: Secondary | ICD-10-CM | POA: Diagnosis not present

## 2023-05-10 DIAGNOSIS — R110 Nausea: Secondary | ICD-10-CM | POA: Diagnosis not present

## 2023-05-10 DIAGNOSIS — Z860101 Personal history of adenomatous and serrated colon polyps: Secondary | ICD-10-CM | POA: Diagnosis not present

## 2023-05-10 MED ORDER — NA SULFATE-K SULFATE-MG SULF 17.5-3.13-1.6 GM/177ML PO SOLN: ORAL | 0 refills | Status: DC

## 2023-05-10 MED ORDER — ONDANSETRON HCL 4 MG PO TABS: 4.0000 mg | ORAL_TABLET | Freq: Three times a day (TID) | ORAL | 0 refills | Status: AC | PRN

## 2023-05-10 NOTE — Patient Instructions (Addendum)
You have been scheduled for a colonoscopy. Please follow written instructions given to you at your visit today.   Please pick up your prep supplies at the pharmacy within the next 1-3 days.  If you use inhalers (even only as needed), please bring them with you on the day of your procedure.  DO NOT TAKE 7 DAYS PRIOR TO TEST- Trulicity (dulaglutide) Ozempic, Wegovy (semaglutide) Mounjaro (tirzepatide) Bydureon Bcise (exanatide extended release)  DO NOT TAKE 1 DAY PRIOR TO YOUR TEST Rybelsus (semaglutide) Adlyxin (lixisenatide) Victoza (liraglutide) Byetta (exanatide) ___________________________________________________________________________  We have sent the following medications to your pharmacy for you to pick up at your convenience: Suprep  _______________________________________________________  If your blood pressure at your visit was 140/90 or greater, please contact your primary care physician to follow up on this.  _______________________________________________________  If you are age 72 or older, your body mass index should be between 23-30. Your Body mass index is 26.25 kg/m. If this is out of the aforementioned range listed, please consider follow up with your Primary Care Provider.  If you are age 65 or younger, your body mass index should be between 19-25. Your Body mass index is 26.25 kg/m. If this is out of the aformentioned range listed, please consider follow up with your Primary Care Provider.   ________________________________________________________  The [facility] providers would like to encourage you to use [facility] to communicate with providers for non-urgent requests or questions.  Due to long hold times on the telephone, sending your provider a message by [facility] may be a faster and more efficient way to get a response.  Please allow 48 business hours for a response.  Please remember that this is for non-urgent requests.   Due to recent changes in  healthcare laws, you may see the results of your imaging and laboratory studies on MyChart before your provider has had a chance to review them.  We understand that in some cases there may be results that are confusing or concerning to you. Not all laboratory results come back in the same time frame and the provider may be waiting for multiple results in order to interpret others.  Please give [place] 48 hours in order for your provider to thoroughly review all the results before contacting the office for clarification of your results.

## 2023-05-10 NOTE — Progress Notes (Signed)
Agree with assessment and plan as outlined.  

## 2023-06-01 IMAGING — MG DIGITAL DIAGNOSTIC BILAT W/ TOMO W/ CAD
8 series · 8 of 24 positions shown · non-contrast
Comparison: Previous exam(s).

ACR Breast Density Category choose 3
COMPARISON: Previous exam(s).

ACR Breast Density Category choose 3

Addendum:
CLINICAL DATA: Patient states that she previously felt a mass in
the right breast that has resolved. She has no current complaints.

EXAM:
DIGITAL DIAGNOSTIC BILATERAL MAMMOGRAM WITH TOMOSYNTHESIS AND CAD
TECHNIQUE: Bilateral digital diagnostic mammography and breast tomosynthesis
was performed. The images were evaluated with computer-aided
detection.

[L MLO synth-2D]
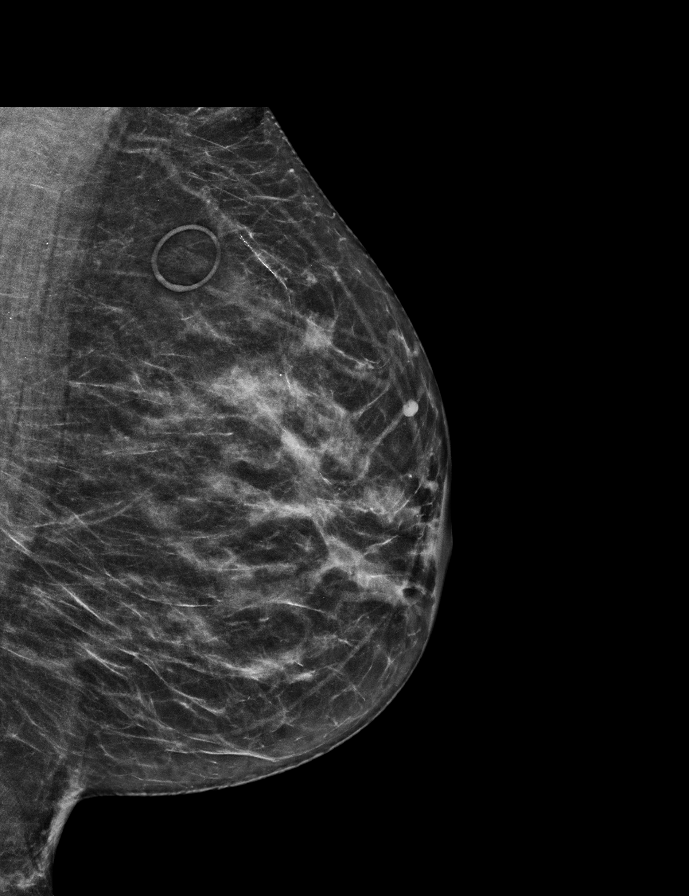

[L CC synth-2D]
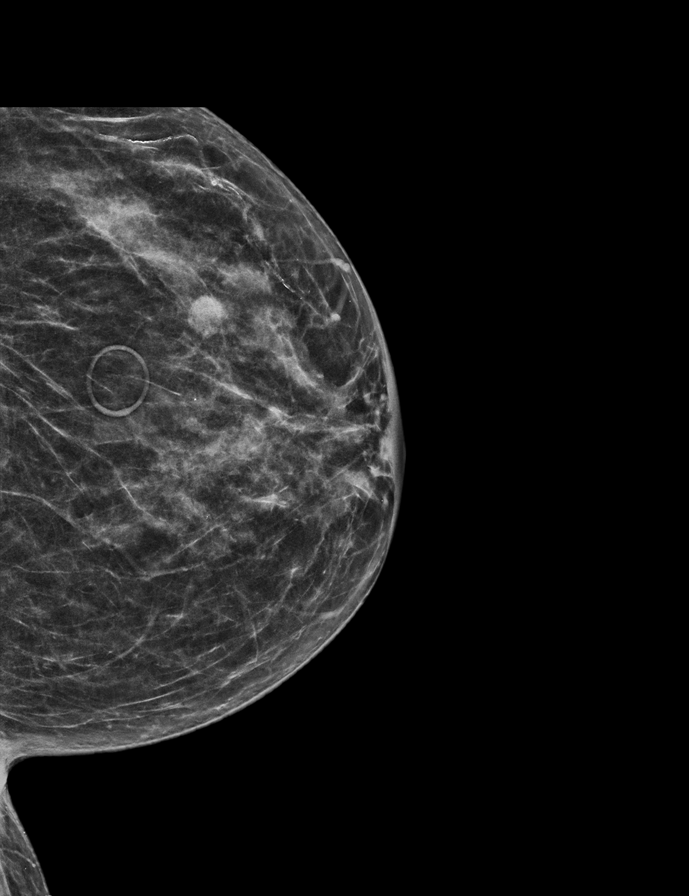

[R CC synth-2D]
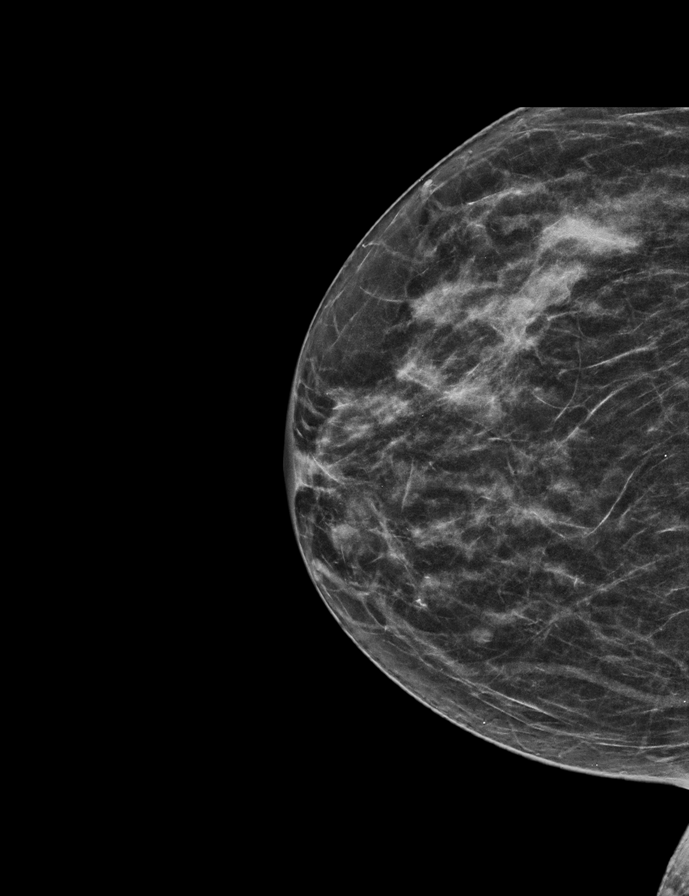

[R MLO synth-2D]
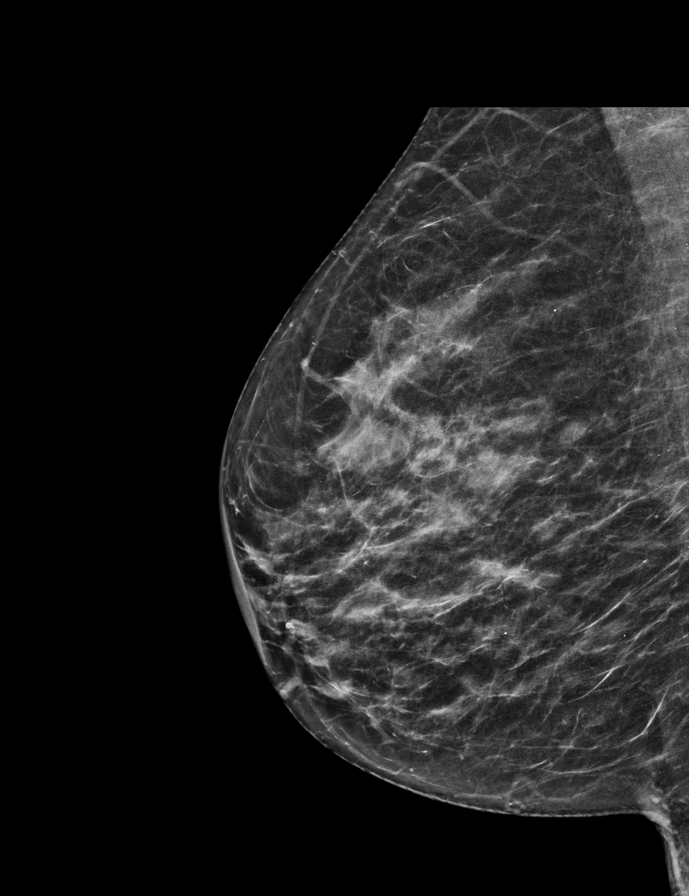

[L MLO tomo · tomo slice 29/57.0]
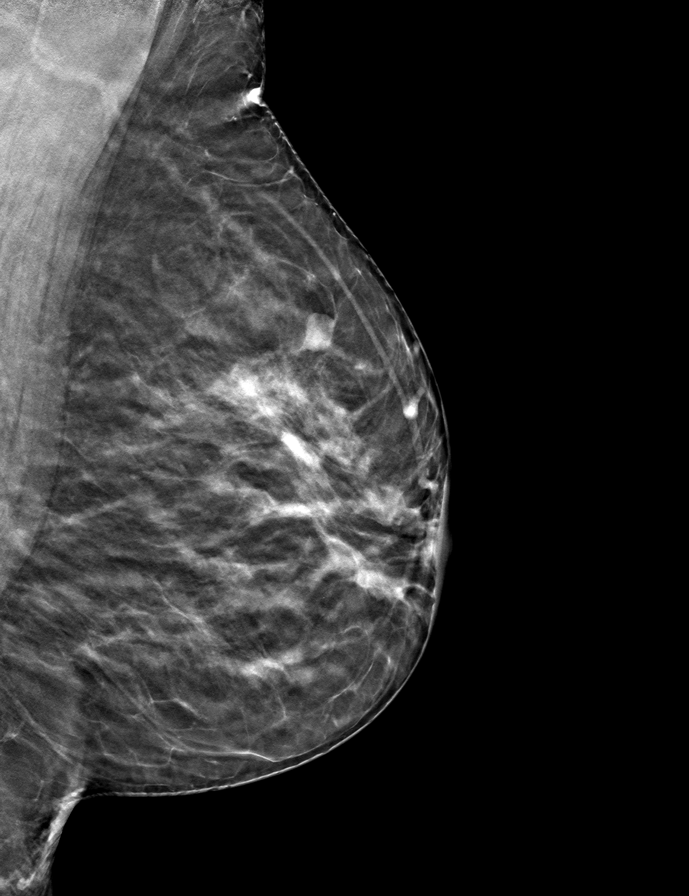

[R CC tomo · tomo slice 27/53.0]
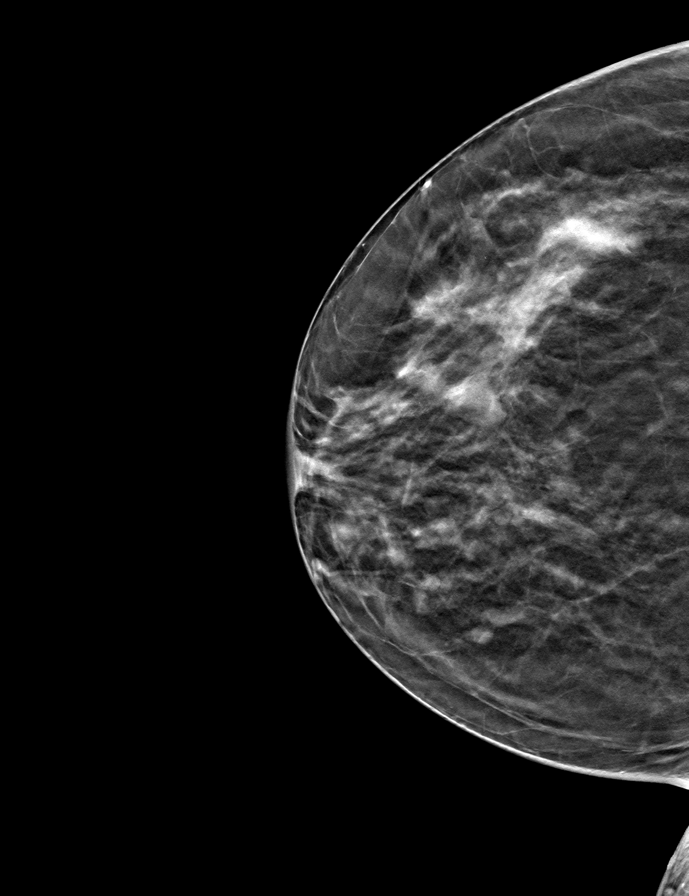

[L CC tomo · tomo slice 28/55.0]
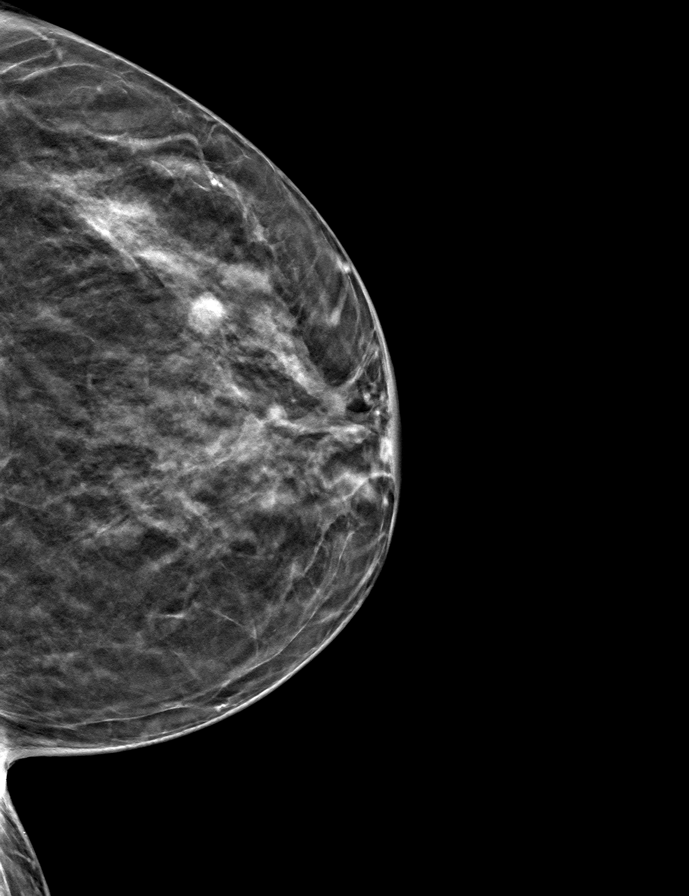

[R MLO tomo · tomo slice 28/55.0]
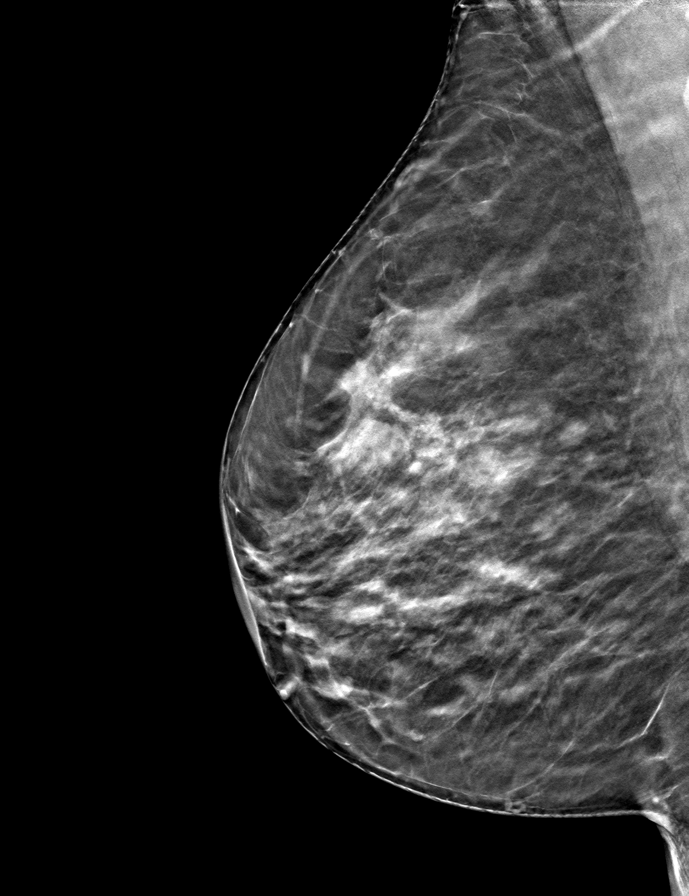

[8 of 24 positions shown; findings below may reference images not displayed]

FINDINGS: No suspicious mass or malignant type microcalcifications identified
in either breast.
IMPRESSION: No evidence of malignancy in either breast.

RECOMMENDATION:
Bilateral screening mammogram in 1 year is recommended.

I have discussed the findings and recommendations with the patient.
If applicable, a reminder letter will be sent to the patient
regarding the next appointment.

BI-RADS CATEGORY  1: Negative.

ADDENDUM:
Breast density category: c: The breast tissue is heterogeneously
dense, which may obscure small masses.

*** End of Addendum ***
FINDINGS: No suspicious mass or malignant type microcalcifications identified
in either breast.
IMPRESSION: No evidence of malignancy in either breast.

RECOMMENDATION:
Bilateral screening mammogram in 1 year is recommended.

I have discussed the findings and recommendations with the patient.
If applicable, a reminder letter will be sent to the patient
regarding the next appointment.

BI-RADS CATEGORY  1: Negative.

## 2023-06-25 ENCOUNTER — Encounter: Payer: Self-pay | Admitting: Gastroenterology

## 2023-06-25 ENCOUNTER — Ambulatory Visit (AMBULATORY_SURGERY_CENTER): Payer: Medicare Other | Admitting: Gastroenterology

## 2023-06-25 VITALS — BP 137/73 | HR 62 | Temp 97.0°F | Resp 9 | Ht 62.0 in | Wt 143.8 lb

## 2023-06-25 DIAGNOSIS — Z1211 Encounter for screening for malignant neoplasm of colon: Secondary | ICD-10-CM

## 2023-06-25 DIAGNOSIS — K5730 Diverticulosis of large intestine without perforation or abscess without bleeding: Secondary | ICD-10-CM | POA: Diagnosis not present

## 2023-06-25 DIAGNOSIS — K648 Other hemorrhoids: Secondary | ICD-10-CM | POA: Diagnosis not present

## 2023-06-25 DIAGNOSIS — D120 Benign neoplasm of cecum: Secondary | ICD-10-CM

## 2023-06-25 DIAGNOSIS — K635 Polyp of colon: Secondary | ICD-10-CM

## 2023-06-25 DIAGNOSIS — Z860100 Personal history of colon polyps, unspecified: Secondary | ICD-10-CM

## 2023-06-25 MED ORDER — SODIUM CHLORIDE 0.9 % IV SOLN: 500.0000 mL | Freq: Once | INTRAVENOUS | Status: DC

## 2023-06-25 NOTE — Progress Notes (Signed)
[facility] History and Physical   Primary Care Physician:  Melida Quitter, MD   Reason for Procedure:   History of colon polyps  Plan:    colonoscopy     HPI: Samantha Brady. Janz is a 72 y.o. female  here for colonoscopy surveillance - partial colectomy for advanced polyp in 2006, last exam in 2018 negative.   Patient denies any bowel symptoms at this time. No family history of colon cancer known. Otherwise feels well without any cardiopulmonary symptoms.   I have discussed risks / benefits of anesthesia and endoscopic procedure with [name]. Einspahr and they wish to proceed with the exams as outlined today.    Past Medical History:  Diagnosis Date   Hypertension    Osteopenia    Premature ventricular contraction     Past Surgical History:  Procedure Laterality Date   ABDOMINAL HYSTERECTOMY     BREAST CYST EXCISION Left    unsure of year    BREAST EXCISIONAL BIOPSY Left    COLON RESECTION     pre cancerous tumor    Prior to Admission medications   Medication Sig Start Date End Date Taking? Authorizing Provider  BIOTIN 5000 PO Take by mouth.   Yes [provider]  Cholecalciferol (VITAMIN D) 50 MCG (2000 UT) tablet Take 2,000 Units by mouth daily.   Yes [provider]  diltiazem (CARDIZEM CD) 120 MG 24 hr capsule TAKE 1 CAPSULE (120 MG TOTAL) BY MOUTH DAILY AT 10 PM. 03/31/23 09/27/23 Yes Tolia, Sunit, DO  hydrochlorothiazide (HYDRODIURIL) 25 MG tablet Take 12.5 mg by mouth daily.   Yes [provider]  ondansetron (ZOFRAN) 4 MG tablet Take 1 tablet (4 mg total) by mouth every 8 (eight) hours as needed for nausea or vomiting (take about 30 mins to 1 hour prior to colon prep). 05/10/23   Doree Albee, PA-C    Current Outpatient Medications  Medication Sig Dispense Refill   BIOTIN 5000 PO Take by mouth.     Cholecalciferol (VITAMIN D) 50 MCG (2000 UT) tablet Take 2,000 Units by mouth daily.     diltiazem (CARDIZEM CD) 120 MG 24 hr  capsule TAKE 1 CAPSULE (120 MG TOTAL) BY MOUTH DAILY AT 10 PM. 90 capsule 1   hydrochlorothiazide (HYDRODIURIL) 25 MG tablet Take 12.5 mg by mouth daily.     ondansetron (ZOFRAN) 4 MG tablet Take 1 tablet (4 mg total) by mouth every 8 (eight) hours as needed for nausea or vomiting (take about 30 mins to 1 hour prior to colon prep). 5 tablet 0   Current Facility-Administered Medications  Medication Dose Route Frequency Provider Last Rate Last Admin   0.9 %  sodium chloride infusion  500 mL Intravenous Once Lakeith Careaga, Willaim Rayas, MD        Allergies as of 06/25/2023   (No Known Allergies)    Family History  Problem Relation Age of Onset   Hypertension Mother    Dementia Mother    Prostate cancer Father    Diabetes Father    Hypertension Father    Colon polyps Father    Seizures Sister    Colon cancer Maternal Aunt    Hypertension Son    Breast cancer Neg Hx    Esophageal cancer Neg Hx    Rectal cancer Neg Hx    Stomach cancer Neg Hx     Social History   Socioeconomic History   Marital status: Widowed    Spouse name: Not on  file   Number of children: 2   Years of education: Not on file   Highest education level: Not on file  Occupational History   Occupation: retired  Tobacco Use   Smoking status: Never   Smokeless tobacco: Never  Vaping Use   Vaping status: Never Used  Substance and Sexual Activity   Alcohol use: Yes    Comment: occationally   Drug use: Never   Sexual activity: Yes  Other Topics Concern   Not on file  Social History Narrative   Not on file   Social Drivers of Health   Financial Resource Strain: Low Risk  (10/15/2020)   Overall Financial Resource Strain (CARDIA)    Difficulty of Paying Living Expenses: Not hard at all  Food Insecurity: No Food Insecurity (10/15/2020)   Hunger Vital Sign    Worried About Running Out of Food in the Last Year: Never true    Ran Out of Food in the Last Year: Never true  Transportation Needs: No Transportation  Needs (10/15/2020)   PRAPARE - Administrator, Civil Service (Medical): No    Lack of Transportation (Non-Medical): No  Physical Activity: Inactive (10/15/2020)   Exercise Vital Sign    Days of Exercise per Week: 0 days    Minutes of Exercise per Session: 0 min  Stress: No Stress Concern Present (10/15/2020)   [employer] of Occupational Health - Occupational Stress Questionnaire    Feeling of Stress : Not at all  Social Connections: Moderately Integrated (10/15/2020)   Social Connection and Isolation Panel [NHANES]    Frequency of Communication with Friends and Family: More than three times a week    Frequency of Social Gatherings with Friends and Family: More than three times a week    Attends Religious Services: Never    Database administrator or Organizations: Yes    Attends Banker Meetings: 1 to 4 times per year    Marital Status: Married  Catering manager Violence: Not At Risk (10/15/2020)   Humiliation, Afraid, Rape, and Kick questionnaire    Fear of Current or Ex-Partner: No    Emotionally Abused: No    Physically Abused: No    Sexually Abused: No    Review of Systems: All other review of systems negative except as mentioned in the HPI.  Physical Exam: Vital signs BP (!) 149/80   Pulse 74   Temp (!) 97 F (36.1 C)   Ht 5\' 2"  (1.575 m)   Wt 143 lb 12.8 oz (65.2 kg)   SpO2 94%   BMI 26.30 kg/m   General:   Alert,  Well-developed, pleasant and cooperative in NAD Lungs:  Clear throughout to auscultation.   Heart:  Regular rate and rhythm Abdomen:  Soft, nontender and nondistended.   Neuro/Psych:  Alert and cooperative. Normal mood and affect. A and O x 3  Harlin Rain, MD [facility]

## 2023-06-25 NOTE — Patient Instructions (Addendum)
Recommendation:           - Patient has a contact number available for                            emergencies. The signs and symptoms of potential                            delayed complications were discussed with the                            patient. Return to normal activities tomorrow.                            Written discharge instructions were provided to the                            patient.                           - Resume previous diet.                           - Continue present medications.                           - Await pathology results.                           - Anticipate repeat colonoscopy in 5 years given                            advanced polyp removed in the past  YOU HAD AN ENDOSCOPIC PROCEDURE TODAY AT THE [facility]:   Refer to the procedure report that was given to you for any specific questions about what was found during the examination.  If the procedure report does not answer your questions, please call your gastroenterologist to clarify.  If you requested that your care partner not be given the details of your procedure findings, then the procedure report has been included in a sealed envelope for you to review at your convenience later.  YOU SHOULD EXPECT: Some feelings of bloating in the abdomen. Passage of more gas than usual.  Walking can help get rid of the air that was put into your GI tract during the procedure and reduce the bloating. If you had a lower endoscopy (such as a colonoscopy or flexible sigmoidoscopy) you may notice spotting of blood in your stool or on the toilet paper. If you underwent a bowel prep for your procedure, you may not have a normal bowel movement for a few days.  Please Note:  You might notice some irritation and congestion in your nose or some drainage.  This is from the oxygen used during your procedure.  There is no need for concern and it should clear up in a day or so.  SYMPTOMS TO REPORT  IMMEDIATELY:  Following lower endoscopy (colonoscopy or flexible sigmoidoscopy):  Excessive amounts of blood in the stool  Significant tenderness or worsening of abdominal pains  Swelling of the abdomen that is new, acute  Fever of 100F  or higher  For urgent or emergent issues, a gastroenterologist can be reached at any hour by calling [phone]. Do not use MyChart messaging for urgent concerns.    DIET:  We do recommend a small meal at first, but then you may proceed to your regular diet.  Drink plenty of fluids but you should avoid alcoholic beverages for 24 hours.  ACTIVITY:  You should plan to take it easy for the rest of today and you should NOT DRIVE or use heavy machinery until tomorrow (because of the sedation medicines used during the test).    FOLLOW UP: Our staff will call the number listed on your records the next business day following your procedure.  We will call around 7:15- 8:00 am to check on you and address any questions or concerns that you may have regarding the information given to you following your procedure. If we do not reach you, we will leave a message.     If any biopsies were taken you will be contacted by phone or by letter within the next 1-3 weeks.  Please call us at [phone] if you have not heard about the biopsies in 3 weeks.    SIGNATURES/CONFIDENTIALITY: You and/or your care partner have signed paperwork which will be entered into your electronic medical record.  These signatures attest to the fact that that the information above on your After Visit Summary has been reviewed and is understood.  Full responsibility of the confidentiality of this discharge information lies with you and/or your care-partner.

## 2023-06-25 NOTE — Progress Notes (Signed)
Sedate, gd SR, tolerated procedure well, VSS, report to RN 

## 2023-06-25 NOTE — Progress Notes (Signed)
Called to room to assist during endoscopic procedure.  Patient ID and intended procedure confirmed with present staff. Received instructions for my participation in the procedure from the performing physician.  

## 2023-06-25 NOTE — Op Note (Signed)
[facility] Patient Name: Samantha Brady Procedure Date: 06/25/2023 12:15 PM MRN: [id number] Endoscopist: Viviann Spare P. Adela Lank , MD, [id number] Age: 72 Referring MD:  Date of Birth: 1950-08-27 Gender: Female Account #: [id number] Procedure:                Colonoscopy Indications:              High risk colon cancer surveillance: Personal                            history of colonic polyps - surgical resection of                            advanced polyp in 2006, last exam in 2018 Medicines:                Monitored Anesthesia Care Procedure:                Pre-Anesthesia Assessment:                           - Prior to the procedure, a History and Physical                            was performed, and patient medications and                            allergies were reviewed. The patient's tolerance of                            previous anesthesia was also reviewed. The risks                            and benefits of the procedure and the sedation                            options and risks were discussed with the patient.                            All questions were answered, and informed consent                            was obtained. Prior Anticoagulants: The patient has                            taken no anticoagulant or antiplatelet agents. ASA                            Grade Assessment: II - A patient with mild systemic                            disease. After reviewing the risks and benefits,                            the patient was deemed in satisfactory condition to  undergo the procedure.                           After obtaining informed consent, the colonoscope                            was passed under direct vision. Throughout the                            procedure, the patient's blood pressure, pulse, and                            oxygen saturations were monitored continuously. The                            Olympus  Scope SN: [id number] was introduced through                            the anus and advanced to the the cecum, identified                            by appendiceal orifice and ileocecal valve. The                            colonoscopy was performed without difficulty. The                            patient tolerated the procedure well. The quality                            of the bowel preparation was good. The ileocecal                            valve, appendiceal orifice, and rectum were                            photographed. Scope In: 12:24:20 PM Scope Out: 12:37:16 PM Scope Withdrawal Time: 0 hours 10 minutes 33 seconds  Total Procedure Duration: 0 hours 12 minutes 56 seconds  Findings:                 The perianal and digital rectal examinations were                            normal.                           A 5 mm polyp was found in the ileocecal valve. The                            polyp was flat. The polyp was removed with a cold                            snare. Resection and retrieval were complete.  Multiple small-mouthed diverticula were found in                            the entire colon.                           There was evidence of a prior end-to-end                            colo-colonic anastomosis in the recto-sigmoid                            colon. This was patent and was characterized by                            healthy appearing mucosa.                           Internal hemorrhoids were found during                            retroflexion. The hemorrhoids were small.                           The exam was otherwise without abnormality. Complications:            No immediate complications. Estimated blood loss:                            Minimal. Estimated Blood Loss:     Estimated blood loss was minimal. Impression:               - One 5 mm polyp at the ileocecal valve, removed                            with a cold snare.  Resected and retrieved.                           - Diverticulosis in the entire examined colon.                           - Patent end-to-end colo-colonic anastomosis,                            characterized by healthy appearing mucosa.                           - Internal hemorrhoids.                           - The examination was otherwise normal. Recommendation:           - Patient has a contact number available for                            emergencies. The signs and symptoms of potential  delayed complications were discussed with the                            patient. Return to normal activities tomorrow.                            Written discharge instructions were provided to the                            patient.                           - Resume previous diet.                           - Continue present medications.                           - Await pathology results.                           - Anticipate repeat colonoscopy in 5 years given                            advanced polyp removed in the past Faith Branan P. Efrata Brunner, MD 06/25/2023 12:41:10 PM This report has been signed electronically.

## 2023-06-28 ENCOUNTER — Telehealth: Payer: Self-pay | Admitting: *Deleted

## 2023-06-28 NOTE — Telephone Encounter (Signed)
  Follow up Call-     06/25/2023   11:22 AM  Call back number  Post procedure Call Back phone  # [phone]  Permission to leave phone message Yes     Patient questions:  Do you have a fever, pain , or abdominal swelling? No. Pain Score  0 *  Have you tolerated food without any problems? Yes.    Have you been able to return to your normal activities? Yes.    Do you have any questions about your discharge instructions: Diet   No. Medications  No. Follow up visit  No.  Do you have questions or concerns about your Care? No.  Actions: * If pain score is 4 or above: No action needed, pain <4.

## 2023-06-29 LAB — SURGICAL PATHOLOGY

## 2023-07-28 ENCOUNTER — Encounter: Payer: Self-pay | Admitting: Cardiology

## 2023-07-28 ENCOUNTER — Telehealth: Payer: Self-pay | Admitting: Cardiology

## 2023-07-28 NOTE — Telephone Encounter (Signed)
Spoke with pt over the phone and explained that Dr. Odis Hollingshead stated it was okay to just restart this evenings Diltiazem dose at the normal 10 PM time. Pt verbalized understanding and had no further questions or concerns.

## 2023-07-28 NOTE — Telephone Encounter (Signed)
  Pt c/o medication issue:  1. Name of Medication:   diltiazem (CARDIZEM CD) 120 MG 24 hr capsule    2. How are you currently taking this medication (dosage and times per day)?   TAKE 1 CAPSULE (120 MG TOTAL) BY MOUTH DAILY AT 10 PM.    3. Are you having a reaction (difficulty breathing--STAT)? No   4. What is your medication issue? Pt is following up her mychart message about this medication. She was not home last night and she forgot her diltiazem at home. She is not sure when if she should take one now and take her normal dose tonight at 10 pm

## 2023-09-29 ENCOUNTER — Other Ambulatory Visit: Payer: Self-pay

## 2023-09-29 DIAGNOSIS — I493 Ventricular premature depolarization: Secondary | ICD-10-CM

## 2023-09-29 MED ORDER — DILTIAZEM HCL ER COATED BEADS 120 MG PO CP24: 120.0000 mg | ORAL_CAPSULE | Freq: Every day | ORAL | 2 refills | Status: DC

## 2024-02-07 ENCOUNTER — Other Ambulatory Visit: Payer: Self-pay | Admitting: Internal Medicine

## 2024-02-07 DIAGNOSIS — Z1231 Encounter for screening mammogram for malignant neoplasm of breast: Secondary | ICD-10-CM

## 2024-02-29 ENCOUNTER — Ambulatory Visit
Admission: RE | Admit: 2024-02-29 | Discharge: 2024-02-29 | Disposition: A | Discharge status: Home / Self Care | Source: Ambulatory Visit | Attending: Internal Medicine | Admitting: Internal Medicine

## 2024-02-29 DIAGNOSIS — Z1231 Encounter for screening mammogram for malignant neoplasm of breast: Secondary | ICD-10-CM

## 2024-06-18 ENCOUNTER — Other Ambulatory Visit: Payer: Self-pay | Admitting: Cardiology

## 2024-06-18 DIAGNOSIS — I493 Ventricular premature depolarization: Secondary | ICD-10-CM

## 2024-07-10 ENCOUNTER — Telehealth (HOSPITAL_BASED_OUTPATIENT_CLINIC_OR_DEPARTMENT_OTHER): Payer: Self-pay

## 2024-07-10 NOTE — Telephone Encounter (Signed)
 Anesthesia to be used is Local, they may use epi with the local as possibilty.

## 2024-07-10 NOTE — Telephone Encounter (Signed)
"  ° °  Pre-operative Risk Assessment    Patient Name: Samantha Brady. Poster  DOB: 23-Mar-1951 MRN: [id number]   Date of last office visit: 04/13/2023 with Dr. Michele Date of next office visit: 07/18/2024 with Dr. Michele  Request for Surgical Clearance    Procedure:  Dental Extraction - Amount of Teeth to be Pulled:  ONE complex extraction and one full size implant with bone graft  Date of Surgery:  Clearance TBD                                 Surgeon:  Dr. Blanchard Aurora, DMD Surgeon's Group or Practice Name:  [facility]  Phone number:  [phone] Fax number:  [phone]   Type of Clearance Requested:   - Medical    Type of Anesthesia:  Local with possible epinephrine. IS EPINEPHRINE OK? IF SO, IS THERE ANY LIMIT?   Additional requests/questions:  None  Signed, Patrcia Iverson CROME   07/10/2024, 11:49 AM   "

## 2024-07-10 NOTE — Telephone Encounter (Signed)
 See notes from preop APP Lamarr CROME. DNP pt has appt Dr. Michele 07/18/24 for preop clearance to be addressed then.

## 2024-07-10 NOTE — Telephone Encounter (Signed)
" ° °  Name: Samantha Brady. Samantha Brady  DOB: 12-27-1950  MRN: [id number]  Primary Cardiologist: Madonna Large, DO  Chart reviewed as part of pre-operative protocol coverage. The patient has an upcoming visit scheduled with Dr. Large on 07/18/2024 at which time clearance can be addressed in case there are any issues that would impact surgical recommendations.  Dental Extraction - Amount of Teeth to be Pulled:  ONE complex extraction and one full size implant with bone graft Is not scheduled until TBD as below. I added preop FYI to appointment note so that provider is aware to address at time of outpatient visit.  Per office protocol the cardiology provider should forward their finalized clearance decision and recommendations regarding antiplatelet therapy to the requesting party below.    Patient is not on anticoagulation or antiplatelet per review of current medical record in Epic.    I will route this message as FYI to requesting party and remove this message from the preop box as separate preop APP input not needed at this time.   Please call with any questions.  Lamarr Satterfield, NP  07/10/2024, 11:57 AM   "

## 2024-07-10 NOTE — Telephone Encounter (Signed)
 Please verify the type of anesthesia planned? Epinephrine is not an anesthetic.   Thanks.   Dr. Daksh Coates

## 2024-07-18 ENCOUNTER — Ambulatory Visit: Attending: Cardiology | Admitting: Cardiology

## 2024-07-18 ENCOUNTER — Encounter: Payer: Self-pay | Admitting: Cardiology

## 2024-07-18 VITALS — BP 140/72 | HR 66 | Ht 63.0 in | Wt 148.0 lb

## 2024-07-18 DIAGNOSIS — I10 Essential (primary) hypertension: Secondary | ICD-10-CM | POA: Insufficient documentation

## 2024-07-18 DIAGNOSIS — E782 Mixed hyperlipidemia: Secondary | ICD-10-CM | POA: Insufficient documentation

## 2024-07-18 DIAGNOSIS — I493 Ventricular premature depolarization: Secondary | ICD-10-CM | POA: Diagnosis present

## 2024-07-18 DIAGNOSIS — Z01810 Encounter for preprocedural cardiovascular examination: Secondary | ICD-10-CM | POA: Insufficient documentation

## 2024-07-18 NOTE — Patient Instructions (Signed)
 Medication Instructions:  Your physician recommends that you continue on your current medications as directed. Please refer to the Current Medication list given to you today.  *If you need a refill on your cardiac medications before your next appointment, please call your pharmacy*  Lab Work: None ordered If you have labs (blood work) drawn today and your tests are completely normal, you will receive your results only by: MyChart Message (if you have MyChart) OR A paper copy in the mail If you have any lab test that is abnormal or we need to change your treatment, we will call you to review the results.  Testing/Procedures: None ordered  Follow-Up: At [facility], you and your health needs are our priority.  As part of our continuing mission to provide you with exceptional heart care, our providers are all part of one team.  This team includes your primary Cardiologist (physician) and Advanced Practice Providers or APPs (Physician Assistants and Nurse Practitioners) who all work together to provide you with the care you need, when you need it.  Your next appointment:   1 year(s)  Provider:   Madonna Large, DO    We recommend signing up for the patient portal called MyChart.  Sign up information is provided on this After Visit Summary.  MyChart is used to connect with patients for Virtual Visits (Telemedicine).  Patients are able to view lab/test results, encounter notes, upcoming appointments, etc.  Non-urgent messages can be sent to your provider as well.   To learn more about what you can do with MyChart, go to [email or web address].

## 2024-07-18 NOTE — Progress Notes (Signed)
 " Cardiology Office Note:  .   ID:  Rock HERO. Samantha, Brady 1951-06-11, MRN [id number] PCP:  Stephane Leita DEL, MD  Former Cardiology Providers: None [facility] Providers Cardiologist:  Madonna Large, DO , [facility] (established care 02/05/2022) Electrophysiologist:  None  Click to update primary MD,subspecialty MD or APP then REFRESH:1}    History of Present Illness: .   Samantha Brady is a 74 y.o. Caucasian female whose past medical history and cardiovascular risk factors includes: Premature ventricular contractions, HTN, osteopenia, postmenopausal female, advanced age.   Brief History:  Patient was referred to the practice for evaluation of near syncope/syncope. In June 2023 when she was traveling to [place]  for upcoming cruise she was in the bathroom taking a shower under warm water without any prodromal symptoms had a syncopal event. She had fell on the bathroom floor and her friends accompanied her and assisted her. She regained consciousness within 2 seconds or so and did not sustain any significant injury and she continued on her cruise. In July 2023 she had a near syncopal event and because of these 2 instances was referred to cardiology for further evaluation and management.   Cardiac workup included a Zio patch which noted a PVC burden of approximately 4.4%.  She was placed on diltiazem  and her symptoms had improved significantly. She had a repeat ZIO patch which noted improvement in PVC burden.    Later she was recommended to be on lipid-lowering therapy by PCP given her lipid indices and elevated 10-year risk of ASCVD.  However shared decision was to proceed with a coronary calcium score.  Her coronary calcium score was 0 and therefore she was not started on medical therapy.    She presents today for follow-up as well as preoperative risk assessment.  She is being considered for 1 complex teeth extraction with full implant with bone graft with Dr. Vincente at [facility].   They are requesting medical clearance and the use of possible epinephrine.  No anginal chest pain or heart failure symptoms. Office blood pressures are acceptable but not at goal, patient forgot to take pills. She walks 30 to 45 minutes daily without limitations. Her prior cardiovascular testing results reviewed.  Review of Systems: .   Review of Systems  Cardiovascular:  Negative for chest pain, claudication, dyspnea on exertion, irregular heartbeat, leg swelling, near-syncope, orthopnea, palpitations, paroxysmal nocturnal dyspnea and syncope.  Respiratory:  Negative for shortness of breath.   Hematologic/Lymphatic: Negative for bleeding problem.  Musculoskeletal:  Negative for muscle cramps and myalgias.  Neurological:  Negative for dizziness and light-headedness.    Studies Reviewed:   EKG: EKG Interpretation Date/Time:  Tuesday July 18 2024 08:17:05 EST Ventricular Rate:  77 PR Interval:  164 QRS Duration:  82 QT Interval:  372 QTC Calculation: 420 R Axis:   92  Text Interpretation: Normal sinus rhythm Rightward axis Consider Septal infarct (cited on or before 13-Apr-2023) When compared with ECG of 13-Apr-2023 10:08, No significant change since last tracing Confirmed by Large Madonna [phone]) on 07/18/2024 8:21:28 AM  Echocardiogram: 03/19/2022:  Normal LV systolic function with visual EF 55-60%. Left ventricle cavity is normal in size. Normal left ventricular wall thickness. Normal global wall motion. Normal diastolic filling pattern, normal LAP. Calculated EF 55%.  Structurally normal tricuspid valve with trace regurgitation. No evidence of pulmonary hypertension.  No prior available for comparison.    Stress Testing: Exercise treadmill stress test 03/20/2022: Exercise treadmill stress test performed using Bruce protocol.  Patient reached 5.9 METS, and 90% of age predicted maximum heart rate.  Exercise capacity was low.  No chest pain reported.  Normal heart rate and  hemodynamic response. Stress EKG revealed no ischemic changes.  Frequent multifocal PVC's noted during rest and stress. Recommend clinical correlation.    Heart Catheterization: None   Cardiac monitor (Zio Patch): Dominant rhythm sinus. Heart rate 42-162 bpm.  Avg HR 78 bpm. No atrial fibrillation, supraventricular tachycardia,  high grade AV block. 1 asymptomatic episode of NSVT, 4 beats, 2.3 seconds duration, max HR 162 bpm.  1 asymptomatic episode of sinus pause 3.2 seconds duration, on 02/20/2022 at 2 AM. Total ventricular ectopic burden 4.4%. Total supraventricular ectopic burden <1%. Patient triggered events: 1.  Underlying rhythm normal sinus.  Cardiac monitor (Zio Patch): October 06, 2022 -October 13, 2022 Dominant rhythm sinus. Heart rate 52-142 bpm.  Avg HR 78 bpm. No atrial fibrillation, supraventricular tachycardia, ventricular tachycardia, high grade AV block, pauses (3 seconds or longer). Total ventricular ectopic burden <1%. Total supraventricular ectopic burden approximately 1.1% (predominantly isolated beats). Patient triggered events: 0.  Coronary artery calcification 02/15/2023 Total Agatston score: 0   RADIOLOGY: NA  Risk Assessment/Calculations:   NA   Labs:    Collected 01/16/2022 provided by PCP Total cholesterol 202, triglycerides 63, HDL 64, LDL 124, non-HDL 137. TSH 1.67 Hemoglobin 14.1 g/dL, hematocrit 58.8% Sodium 142, potassium 3.7, chloride 104, bicarb 31, BUN 15, creatinine 0.7 AST 17, ALT 16, alkaline phosphatase 79  External Labs: Collected: January 27, 2024 [facility] database. Total cholesterol 185, triglycerides 76, HDL 47, LDL 123. Potassium 3.9. TSH 1.9  Physical Exam:    Today's Vitals   07/18/24 0812 07/18/24 0834  BP: (!) 156/88 (!) 140/72  Pulse: 64 66  SpO2: 98%   Weight: 148 lb (67.1 kg)   Height: 5' 3 (1.6 m)     Body mass index is 26.22 kg/m. Wt Readings from Last 3 Encounters:  07/18/24 148 lb (67.1 kg)  06/25/23 143 lb  12.8 oz (65.2 kg)  05/10/23 143 lb 8 oz (65.1 kg)    Physical Exam  Constitutional: She appears healthy.  Neck: No JVD present.  Cardiovascular: Normal rate, regular rhythm, S1 normal, S2 normal, intact distal pulses and normal pulses. Exam reveals no gallop, no S3 and no S4.  No murmur heard. Pulmonary/Chest: Effort normal. No stridor. She has no wheezes. She has no rales.  Abdominal: Soft. Bowel sounds are normal. She exhibits no distension. There is no abdominal tenderness.  Musculoskeletal:        General: No edema.  Neurological: She is alert and oriented to person, place, and time. She has intact cranial nerves (2-12).  Skin: Skin is warm and moist.     Impression & Recommendation(s):  Impression:   ICD-10-CM   1. Premature ventricular contraction  I49.3     2. Preop cardiovascular exam  Z01.810 EKG 12-Lead    3. Essential hypertension  I10     4. Mixed hyperlipidemia  E78.2        Recommendation(s):  Premature ventricular contraction Asymptomatic. Prior PVC burden 4.4% and most recent cardiac monitor noted PVC burden <1%. Continue Cardizem  120 mg p.o. daily EKG today illustrates sinus rhythm without ectopy  Preop cardiovascular exam Patient being considered for dental extraction with bone graft and implant No anginal chest pain or heart failure symptoms According to the Revised Cardiac Risk Index (RCRI), her Perioperative Risk of Major Cardiac Event is (%): 0.4 Her Functional Capacity in METs  is: 5.62 according to the Duke Activity Status Index (DASI). EKG is nonischemic Prior cardiovascular testing reviewed with no additional testing warranted Patient is acceptable risk for dental extraction. Patient states to hold off on sending a letter to Dr. Vincente as she is considering a different practice for financial reasons.  She will call us  back where to send the preop letter to  Essential hypertension Office blood pressures are not at goal. She forgot to take her  medications. Continue hydrochlorothiazide  12.5 mg p.o. daily. Continue Cardizem  120 mg p.o. daily Recommended ambulatory blood pressure monitoring Reemphasized importance of low-salt diet.  Mixed hyperlipidemia Most recent LDL 123 mg/dL. Prior coronary calcium score 0 and therefore patient wanted to hold off on pharmacological therapy.  She still feels the same.  Patient has reservations with regards to statin therapy. I have asked her to discuss other options such as Zetia with PCP and consider repeat fasting lipids in 6 weeks after starting pharmacological therapy.  She will call us  if any questions or concerns arise  Orders Placed:  Orders Placed This Encounter  Procedures   EKG 12-Lead   Final Medication List:   No orders of the defined types were placed in this encounter.   There are no discontinued medications.   Current Outpatient Medications:    BIOTIN 5000 PO, Take by mouth., Disp: , Rfl:    Cholecalciferol (VITAMIN D) 50 MCG (2000 UT) tablet, Take 2,000 Units by mouth daily., Disp: , Rfl:    diltiazem  (CARDIZEM  CD) 120 MG 24 hr capsule, TAKE 1 CAPSULE BY MOUTH DAILY AT 10PM, Disp: 30 capsule, Rfl: 0   doxycycline (VIBRAMYCIN) 50 MG capsule, Take 50 mg by mouth daily. (Patient taking differently: Take 50 mg by mouth 3 (three) times a week.), Disp: , Rfl:    hydrochlorothiazide  (HYDRODIURIL ) 25 MG tablet, Take 12.5 mg by mouth daily., Disp: , Rfl:    ondansetron  (ZOFRAN ) 4 MG tablet, Take 1 tablet (4 mg total) by mouth every 8 (eight) hours as needed for nausea or vomiting (take about 30 mins to 1 hour prior to colon prep)., Disp: 5 tablet, Rfl: 0  Consent:      N/A  Disposition:   1 year follow-up sooner if needed  Her questions and concerns were addressed to her satisfaction. She voices understanding of the recommendations provided during this encounter.    Signed, Madonna Michele HAS, [facility]  A Division of [facility] [address] [place], [place] [address]   "

## 2024-07-23 ENCOUNTER — Other Ambulatory Visit: Payer: Self-pay | Admitting: Cardiology

## 2024-07-23 DIAGNOSIS — I493 Ventricular premature depolarization: Secondary | ICD-10-CM

## 2024-07-26 ENCOUNTER — Encounter: Payer: Self-pay | Admitting: Cardiology

## 2024-07-26 NOTE — Telephone Encounter (Signed)
 In accordance with refill protocols, please review and address the following requirements before this medication refill can be authorized:  Labs
